# Patient Record
Sex: Female | Born: 1985 | Race: White | Hispanic: No | Marital: Married | Smoking: Former smoker
Health system: Southern US, Community
[De-identification: ages and names within clinical notes are randomized; demographics above are authoritative.]

## PROBLEM LIST (undated history)

## (undated) DIAGNOSIS — E039 Hypothyroidism, unspecified: Secondary | ICD-10-CM

## (undated) DIAGNOSIS — E119 Type 2 diabetes mellitus without complications: Secondary | ICD-10-CM

## (undated) DIAGNOSIS — G43909 Migraine, unspecified, not intractable, without status migrainosus: Secondary | ICD-10-CM

## (undated) DIAGNOSIS — I10 Essential (primary) hypertension: Secondary | ICD-10-CM

## (undated) HISTORY — PX: DILATION AND CURETTAGE OF UTERUS: SHX78

## (undated) SURGERY — Surgical Case
Anesthesia: *Unknown

---
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
## 2005-12-12 ENCOUNTER — Emergency Department (HOSPITAL_COMMUNITY): Admission: EM | Admit: 2005-12-12 | Discharge: 2005-12-12 | Payer: Self-pay | Admitting: Family Medicine

## 2005-12-13 ENCOUNTER — Emergency Department (HOSPITAL_COMMUNITY): Admission: EM | Admit: 2005-12-13 | Discharge: 2005-12-13 | Payer: Self-pay | Admitting: *Deleted

## 2007-10-01 ENCOUNTER — Emergency Department (HOSPITAL_COMMUNITY): Admission: EM | Admit: 2007-10-01 | Discharge: 2007-10-01 | Payer: Self-pay | Admitting: Emergency Medicine

## 2008-08-11 ENCOUNTER — Ambulatory Visit (HOSPITAL_COMMUNITY): Admission: RE | Admit: 2008-08-11 | Discharge: 2008-08-11 | Payer: Self-pay | Admitting: Obstetrics and Gynecology

## 2008-08-28 ENCOUNTER — Emergency Department (HOSPITAL_COMMUNITY): Admission: EM | Admit: 2008-08-28 | Discharge: 2008-08-28 | Payer: Self-pay | Admitting: Family Medicine

## 2008-09-10 ENCOUNTER — Ambulatory Visit (HOSPITAL_COMMUNITY): Admission: RE | Admit: 2008-09-10 | Discharge: 2008-09-10 | Payer: Self-pay | Admitting: Obstetrics and Gynecology

## 2010-10-18 LAB — POCT I-STAT, CHEM 8
BUN: 12 mg/dL (ref 6–23)
Chloride: 106 mEq/L (ref 96–112)
Creatinine, Ser: 0.7 mg/dL (ref 0.4–1.2)
Potassium: 4 mEq/L (ref 3.5–5.1)
Sodium: 141 mEq/L (ref 135–145)

## 2011-07-03 ENCOUNTER — Encounter (HOSPITAL_COMMUNITY): Admission: RE | Payer: Self-pay | Source: Ambulatory Visit

## 2011-07-03 ENCOUNTER — Inpatient Hospital Stay (HOSPITAL_COMMUNITY)
Admission: RE | Admit: 2011-07-03 | Payer: BC Managed Care – PPO | Source: Ambulatory Visit | Admitting: Obstetrics and Gynecology

## 2011-07-03 SURGERY — DILATATION AND CURETTAGE /HYSTEROSCOPY
Anesthesia: Choice

## 2012-09-11 ENCOUNTER — Other Ambulatory Visit (HOSPITAL_COMMUNITY)
Admission: RE | Admit: 2012-09-11 | Discharge: 2012-09-11 | Disposition: A | Discharge status: Home / Self Care | Payer: BC Managed Care – PPO | Source: Ambulatory Visit | Attending: Obstetrics and Gynecology | Admitting: Obstetrics and Gynecology

## 2012-09-11 ENCOUNTER — Other Ambulatory Visit: Payer: Self-pay | Admitting: Obstetrics and Gynecology

## 2012-09-11 DIAGNOSIS — Z1151 Encounter for screening for human papillomavirus (HPV): Secondary | ICD-10-CM | POA: Insufficient documentation

## 2012-09-11 DIAGNOSIS — Z01419 Encounter for gynecological examination (general) (routine) without abnormal findings: Secondary | ICD-10-CM | POA: Insufficient documentation

## 2013-12-17 ENCOUNTER — Other Ambulatory Visit: Payer: Self-pay | Admitting: Obstetrics and Gynecology

## 2014-01-26 ENCOUNTER — Ambulatory Visit: Payer: BC Managed Care – PPO | Attending: Gynecologic Oncology | Admitting: Gynecologic Oncology

## 2015-04-06 ENCOUNTER — Other Ambulatory Visit: Payer: Self-pay | Admitting: Obstetrics and Gynecology

## 2015-04-08 LAB — CYTOLOGY - PAP

## 2016-03-16 ENCOUNTER — Encounter: Payer: Self-pay | Admitting: Medical Oncology

## 2016-03-16 ENCOUNTER — Emergency Department
Admission: EM | Admit: 2016-03-16 | Discharge: 2016-03-16 | Disposition: A | Discharge status: Home / Self Care | Payer: 59 | Attending: Emergency Medicine | Admitting: Emergency Medicine

## 2016-03-16 DIAGNOSIS — Z79899 Other long term (current) drug therapy: Secondary | ICD-10-CM | POA: Diagnosis not present

## 2016-03-16 DIAGNOSIS — R51 Headache: Secondary | ICD-10-CM | POA: Insufficient documentation

## 2016-03-16 DIAGNOSIS — E119 Type 2 diabetes mellitus without complications: Secondary | ICD-10-CM | POA: Insufficient documentation

## 2016-03-16 DIAGNOSIS — R519 Headache, unspecified: Secondary | ICD-10-CM

## 2016-03-16 HISTORY — DX: Migraine, unspecified, not intractable, without status migrainosus: G43.909

## 2016-03-16 HISTORY — DX: Type 2 diabetes mellitus without complications: E11.9

## 2016-03-16 MED ORDER — METOCLOPRAMIDE HCL 5 MG/ML IJ SOLN
10.0000 mg | Freq: Once | INTRAMUSCULAR | Status: AC
  Administered 2016-03-16: 10 mg via INTRAVENOUS
  Filled 2016-03-16: qty 2

## 2016-03-16 MED ORDER — KETOROLAC TROMETHAMINE 30 MG/ML IJ SOLN
30.0000 mg | Freq: Once | INTRAMUSCULAR | Status: AC
  Administered 2016-03-16: 30 mg via INTRAVENOUS
  Filled 2016-03-16: qty 1

## 2016-03-16 MED ORDER — DIPHENHYDRAMINE HCL 50 MG/ML IJ SOLN
25.0000 mg | Freq: Once | INTRAMUSCULAR | Status: AC
  Administered 2016-03-16: 25 mg via INTRAVENOUS
  Filled 2016-03-16: qty 1

## 2016-03-16 NOTE — ED Provider Notes (Signed)
[facility] Emergency Department Provider Note ____________________________________________   I have reviewed the triage vital signs and the triage nursing note.  HISTORY  Chief Complaint Migraine   Historian Patient  HPI Donna BaneJennifer Santiago is a 30 y.o. female with a history of migraines, takes imitrex at home, presents for headache onset last night, gradual and persistent and severe.  Usually starts behind both eyes and now is global. Similar prior migraines, but this seems to be worse in terms of length and severity. She feels like she's had some decreased/blurry vision. She often does have vision changes with her migraines. She tells me that she also had a history of pseudotumor cerebri when she was younger and has had a lumbar puncture in the past, sound like this was many years ago. Currently she does not follow with a neurologist.  Headache is moderate to severe. Nothing makes it worse or better. Nausea without vomiting.    Past Medical History:  Diagnosis Date  . Diabetes mellitus without complication (HCC)   . Migraines   Pseudo-tumor cerebri  There are no active problems to display for this patient.   No past surgical history on file.  Prior to Admission medications   Medication Sig Start Date End Date Taking? Authorizing Provider  glipiZIDE (GLUCOTROL XL) 5 MG 24 hr tablet Take 1 tablet by mouth daily. 02/25/16  Yes Historical Provider, MD  levothyroxine (SYNTHROID, LEVOTHROID) 112 MCG tablet Take 1 tablet by mouth 2 (two) times daily.  01/18/16 01/17/17 Yes Historical Provider, MD  norethindrone-ethinyl estradiol-iron (JUNEL FE 1.5/30) 1.5-30 MG-MCG tablet Take 1 tablet by mouth daily. 09/21/15  Yes Historical Provider, MD  SUMAtriptan (IMITREX) 100 MG tablet Take 1 tablet by mouth daily as needed. 01/28/16  Yes Historical Provider, MD    Allergies  Allergen Reactions  . Penicillins   . Prednisone     No family history on file.  Social  History Social History  Substance Use Topics  . Smoking status: Not on file  . Smokeless tobacco: Not on file  . Alcohol use Not on file    Review of Systems  Constitutional: Negative for fever. Eyes: Blurry/dim vision. ENT: Negative for sore throat. Cardiovascular: Negative for chest pain. Respiratory: Negative for shortness of breath. Gastrointestinal: Negative for abdominal pain, vomiting and diarrhea. Genitourinary: Negative for dysuria. Musculoskeletal: Negative for back pain. Skin: Negative for rash. Neurological: Positive for headache. 10 point Review of Systems otherwise negative ____________________________________________   PHYSICAL EXAM:  VITAL SIGNS: ED Triage Vitals  Enc Vitals Group     BP 03/16/16 0922 (!) 173/96     Pulse Rate 03/16/16 0922 73     Resp 03/16/16 0922 18     Temp 03/16/16 0922 98 F (36.7 C)     Temp Source 03/16/16 0922 Oral     SpO2 03/16/16 0922 99 %     Weight 03/16/16 0923 (!) 318 lb (144.2 kg)     Height 03/16/16 0923 5\' 4"  (1.626 m)     Head Circumference --      Peak Flow --      Pain Score 03/16/16 0923 10     Pain Loc --      Pain Edu? --      Excl. in GC? --      Constitutional: Alert and oriented. Well appearing and in no distress. HEENT   Head: Normocephalic and atraumatic.      Eyes: Conjunctivae are normal. PERRL. Normal extraocular movements.  No papilledema  Ears:         Nose: No congestion/rhinnorhea.   Mouth/Throat: Mucous membranes are moist.   Neck: No stridor. Cardiovascular/Chest: Normal rate, regular rhythm.  No murmurs, rubs, or gallops. Respiratory: Normal respiratory effort without tachypnea nor retractions. Breath sounds are clear and equal bilaterally. No wheezes/rales/rhonchi. Gastrointestinal: Soft. No distention, no guarding, no rebound. Nontender.  Morbidly obese.  Genitourinary/rectal:Deferred Musculoskeletal: Nontender with normal range of motion in all extremities. No joint  effusions.  No lower extremity tenderness.  No edema. Neurologic:  Normal speech and language. No gross or focal neurologic deficits are appreciated. Skin:  Skin is warm, dry and intact. No rash noted. Psychiatric: Mood and affect are normal. Speech and behavior are normal. Patient exhibits appropriate insight and judgment.   ____________________________________________  LABS (pertinent positives/negatives)  Labs Reviewed - No data to display  ____________________________________________    EKG I, Governor Rooks, MD, the attending physician have personally viewed and interpreted all ECGs.  None ____________________________________________  RADIOLOGY All Xrays were viewed by me. Imaging interpreted by Radiologist.  None __________________________________________  PROCEDURES  Procedure(s) performed: None  Critical Care performed: None  ____________________________________________   ED COURSE / ASSESSMENT AND PLAN  Pertinent labs & imaging results that were available during my care of the patient were reviewed by me and considered in my medical decision making (see chart for details).   Ms. Geddis presented today planning of a migraine headache. After further and full history, it turns out she also carried a diagnosis of pseudotumor cerebri in the past. She is complaining of some vision changes, sound like more blurriness rather than loss of vision. No papilledema on my exam. I am going to treat her for migraine type headache with Toradol, Benadryl, and Reglan.  She states that she had head imaging years ago, I don't see a focal neurologic deficit to make me more concerned for an intracranial emergency for imaging.  At 1:30 PM, I rechecked on her and she feels like her headache is gone and no vision changes. I'm going to go ahead and discharge her home, and I recommended follow-up with her primary care physician, neurology, and ophthalmology with her history of increased  frequency of headaches and history of pseudotumor cerebri in the past.    CONSULTATIONS:  None    Patient / Family / Caregiver informed of clinical course, medical decision-making process, and agree with plan.   I discussed return precautions, follow-up instructions, and discharge instructions with patient and/or family.   ___________________________________________   FINAL CLINICAL IMPRESSION(S) / ED DIAGNOSES   Final diagnoses:  Headache, unspecified headache type              Note: This dictation was prepared with Dragon dictation. Any transcriptional errors that result from this process are unintentional    Governor Rooks, MD 03/16/16 1347

## 2016-03-16 NOTE — Discharge Instructions (Signed)
You were evaluated for headache similar to prior migraines. He was given treatment for this in the emergency department, and with her headache gone, we discussed discharge from the emergency department now.  Return to the emergency department for any worsening headache, any loss of vision, vision changes, fever, vomiting, weakness, numbness, passing out, or any other symptoms concerning to you.  I recommend he follow up with primary care physician within one week. I am recommending that because of your history of pseudotumor cerebri, I would like you to follow-up with an ophthalmologist for a dilated eye exam. Because of frequency and severity of headaches and your history of migraines and pseudotumor cerebri, I would like you to follow-up with a neurologist, and Dr. Margaretmary EddyShah's office number is provided.

## 2016-03-16 NOTE — ED Triage Notes (Signed)
Pt reports she has a hx of migraines, began yesterday with migraine and this am with vomiting. Home meds have not helped.

## 2016-04-17 ENCOUNTER — Other Ambulatory Visit: Payer: Self-pay | Admitting: Neurology

## 2016-04-17 DIAGNOSIS — G43909 Migraine, unspecified, not intractable, without status migrainosus: Secondary | ICD-10-CM

## 2016-04-17 DIAGNOSIS — G932 Benign intracranial hypertension: Secondary | ICD-10-CM

## 2016-05-09 ENCOUNTER — Ambulatory Visit: Payer: 59

## 2016-05-12 ENCOUNTER — Ambulatory Visit
Admission: RE | Admit: 2016-05-12 | Discharge: 2016-05-12 | Disposition: A | Discharge status: Home / Self Care | Payer: 59 | Source: Ambulatory Visit | Attending: Neurology | Admitting: Neurology

## 2016-05-12 DIAGNOSIS — J351 Hypertrophy of tonsils: Secondary | ICD-10-CM | POA: Diagnosis not present

## 2016-05-12 DIAGNOSIS — G43909 Migraine, unspecified, not intractable, without status migrainosus: Secondary | ICD-10-CM | POA: Insufficient documentation

## 2016-05-12 DIAGNOSIS — J329 Chronic sinusitis, unspecified: Secondary | ICD-10-CM | POA: Insufficient documentation

## 2016-05-12 DIAGNOSIS — G932 Benign intracranial hypertension: Secondary | ICD-10-CM

## 2017-06-21 ENCOUNTER — Other Ambulatory Visit: Payer: Self-pay

## 2017-06-21 ENCOUNTER — Emergency Department
Admission: EM | Admit: 2017-06-21 | Discharge: 2017-06-21 | Disposition: A | Discharge status: Home / Self Care | Payer: BLUE CROSS/BLUE SHIELD | Attending: Emergency Medicine | Admitting: Emergency Medicine

## 2017-06-21 ENCOUNTER — Encounter: Payer: Self-pay | Admitting: Emergency Medicine

## 2017-06-21 DIAGNOSIS — E119 Type 2 diabetes mellitus without complications: Secondary | ICD-10-CM | POA: Diagnosis not present

## 2017-06-21 DIAGNOSIS — G43809 Other migraine, not intractable, without status migrainosus: Secondary | ICD-10-CM | POA: Diagnosis not present

## 2017-06-21 DIAGNOSIS — Z79899 Other long term (current) drug therapy: Secondary | ICD-10-CM | POA: Diagnosis not present

## 2017-06-21 DIAGNOSIS — Z87891 Personal history of nicotine dependence: Secondary | ICD-10-CM | POA: Diagnosis not present

## 2017-06-21 DIAGNOSIS — G43801 Other migraine, not intractable, with status migrainosus: Secondary | ICD-10-CM

## 2017-06-21 DIAGNOSIS — Z7984 Long term (current) use of oral hypoglycemic drugs: Secondary | ICD-10-CM | POA: Diagnosis not present

## 2017-06-21 DIAGNOSIS — R1110 Vomiting, unspecified: Secondary | ICD-10-CM | POA: Diagnosis present

## 2017-06-21 LAB — CBC WITH DIFFERENTIAL/PLATELET
BASOS ABS: 0 10*3/uL (ref 0–0.1)
BASOS PCT: 1 %
Eosinophils Absolute: 0.2 10*3/uL (ref 0–0.7)
Eosinophils Relative: 2 %
HEMATOCRIT: 43.6 % (ref 35.0–47.0)
HEMOGLOBIN: 15 g/dL (ref 12.0–16.0)
Lymphocytes Relative: 20 %
Lymphs Abs: 1.8 10*3/uL (ref 1.0–3.6)
MCH: 31.5 pg (ref 26.0–34.0)
MCHC: 34.5 g/dL (ref 32.0–36.0)
MCV: 91.3 fL (ref 80.0–100.0)
MONOS PCT: 7 %
Monocytes Absolute: 0.6 10*3/uL (ref 0.2–0.9)
NEUTROS ABS: 6.3 10*3/uL (ref 1.4–6.5)
NEUTROS PCT: 70 %
Platelets: 258 10*3/uL (ref 150–440)
RBC: 4.78 MIL/uL (ref 3.80–5.20)
RDW: 12.5 % (ref 11.5–14.5)
WBC: 9 10*3/uL (ref 3.6–11.0)

## 2017-06-21 LAB — COMPREHENSIVE METABOLIC PANEL
ALBUMIN: 3.8 g/dL (ref 3.5–5.0)
ALK PHOS: 75 U/L (ref 38–126)
ALT: 44 U/L (ref 14–54)
AST: 43 U/L — AB (ref 15–41)
Anion gap: 9 (ref 5–15)
BILIRUBIN TOTAL: 0.7 mg/dL (ref 0.3–1.2)
BUN: 8 mg/dL (ref 6–20)
CALCIUM: 8.9 mg/dL (ref 8.9–10.3)
CO2: 24 mmol/L (ref 22–32)
Chloride: 101 mmol/L (ref 101–111)
Creatinine, Ser: 0.65 mg/dL (ref 0.44–1.00)
GFR calc Af Amer: >60 mL/min (ref >60)
GFR calc non Af Amer: >60 mL/min (ref >60)
GLUCOSE: 278 mg/dL — AB (ref 65–99)
Potassium: 4.1 mmol/L (ref 3.5–5.1)
Sodium: 134 mmol/L — ABNORMAL LOW (ref 135–145)
TOTAL PROTEIN: 7.6 g/dL (ref 6.5–8.1)

## 2017-06-21 MED ORDER — DIPHENHYDRAMINE HCL 25 MG PO CAPS
50.0000 mg | ORAL_CAPSULE | Freq: Once | ORAL | Status: AC
  Administered 2017-06-21: 50 mg via ORAL
  Filled 2017-06-21: qty 2

## 2017-06-21 MED ORDER — ONDANSETRON 4 MG PO TBDP
8.0000 mg | ORAL_TABLET | Freq: Once | ORAL | Status: AC
  Administered 2017-06-21: 8 mg via ORAL
  Filled 2017-06-21: qty 2

## 2017-06-21 MED ORDER — DIPHENHYDRAMINE HCL 25 MG PO CAPS: 50.0000 mg | ORAL_CAPSULE | Freq: Four times a day (QID) | ORAL | 0 refills | Status: AC | PRN

## 2017-06-21 MED ORDER — KETOROLAC TROMETHAMINE 60 MG/2ML IM SOLN
15.0000 mg | Freq: Once | INTRAMUSCULAR | Status: AC
  Administered 2017-06-21: 15 mg via INTRAMUSCULAR
  Filled 2017-06-21: qty 2

## 2017-06-21 MED ORDER — METOCLOPRAMIDE HCL 10 MG PO TABS: 10.0000 mg | ORAL_TABLET | Freq: Four times a day (QID) | ORAL | 0 refills | Status: AC | PRN

## 2017-06-21 MED ORDER — GLIPIZIDE ER 5 MG PO TB24: 5.0000 mg | ORAL_TABLET | Freq: Every day | ORAL | 0 refills | Status: AC

## 2017-06-21 MED ORDER — METOCLOPRAMIDE HCL 5 MG/ML IJ SOLN
10.0000 mg | Freq: Once | INTRAMUSCULAR | Status: AC
  Administered 2017-06-21: 10 mg via INTRAMUSCULAR
  Filled 2017-06-21: qty 2

## 2017-06-21 MED ORDER — METOCLOPRAMIDE HCL 5 MG/ML IJ SOLN: 10.0000 mg | Freq: Once | INTRAMUSCULAR | Status: DC

## 2017-06-21 MED ORDER — SUMATRIPTAN SUCCINATE 100 MG PO TABS: 100.0000 mg | ORAL_TABLET | Freq: Every day | ORAL | 0 refills | Status: AC | PRN

## 2017-06-21 MED ORDER — NAPROXEN 500 MG PO TABS: 500.0000 mg | ORAL_TABLET | Freq: Two times a day (BID) | ORAL | 0 refills | Status: AC

## 2017-06-21 MED ORDER — ONDANSETRON 4 MG PO TBDP
4.0000 mg | ORAL_TABLET | Freq: Once | ORAL | Status: AC
  Administered 2017-06-21: 4 mg via ORAL
  Filled 2017-06-21: qty 1

## 2017-06-21 NOTE — ED Provider Notes (Signed)
[facility] Emergency Department Provider Note  ____________________________________________  Time seen: Approximately 8:19 PM  I have reviewed the triage vital signs and the nursing notes.   HISTORY  Chief Complaint Emesis and Migraine    HPI Rio Taber is a 31 y.o. female with a history of migraine headaches and diabetes who complains of a migraine headache that started about 3 AM this morning while she was having some difficulty sleeping. She is out of her sumatriptan and and glipizide and so her blood sugars been running a little higher recently and she is unable to use the Imitrex to abort the migraine which normally works for her. Symptoms of been constant all day, bilateral retro-orbital headache with no vision changes. Feels just like her usual migraines. This associated vomiting as is typical for her. Said a total of 3 episodes of vomiting, which were also blood streaked. She denies any black or bloody or runny stool recently. No history of bleeding ulcers. Not taking a lot of NSAIDs at home for steroids. Denies abdominal pain.     Past Medical History:  Diagnosis Date  . Diabetes mellitus without complication (HCC)   . Migraines      There are no active problems to display for this patient.    History reviewed. No pertinent surgical history.   Prior to Admission medications   Medication Sig Start Date End Date Taking? Authorizing Provider  diphenhydrAMINE (BENADRYL) 25 mg capsule Take 2 capsules (50 mg total) by mouth every 6 (six) hours as needed. 06/21/17   Sharman Cheek, MD  glipiZIDE (GLUCOTROL XL) 5 MG 24 hr tablet Take 1 tablet (5 mg total) by mouth daily. 06/21/17   Sharman Cheek, MD  levothyroxine (SYNTHROID, LEVOTHROID) 112 MCG tablet Take 1 tablet by mouth 2 (two) times daily.  01/18/16 01/17/17  [provider]  metoCLOPramide (REGLAN) 10 MG tablet Take 1 tablet (10 mg total) by mouth every 6 (six) hours as  needed. 06/21/17   Sharman Cheek, MD  naproxen (NAPROSYN) 500 MG tablet Take 1 tablet (500 mg total) by mouth 2 (two) times daily with a meal. 06/21/17   Sharman Cheek, MD  norethindrone-ethinyl estradiol-iron (JUNEL FE 1.5/30) 1.5-30 MG-MCG tablet Take 1 tablet by mouth daily. 09/21/15   [provider]  SUMAtriptan (IMITREX) 100 MG tablet Take 1 tablet (100 mg total) by mouth daily as needed. 06/21/17   Sharman Cheek, MD     Allergies Penicillins and Prednisone   History reviewed. No pertinent family history.  Social History Social History   Tobacco Use  . Smoking status: Former Games developer  . Smokeless tobacco: Never Used  Substance Use Topics  . Alcohol use: No    Frequency: Never  . Drug use: No    Review of Systems  Constitutional:   No fever or chills.  ENT:   No sore throat. No rhinorrhea. Cardiovascular:   No chest pain or syncope. Respiratory:   No dyspnea or cough. Gastrointestinal:   Negative for abdominal pain, positive vomiting as above. No diarrhea or constipation or melena..  Musculoskeletal:   Negative for focal pain or swelling All other systems reviewed and are negative except as documented above in ROS and HPI.  ____________________________________________   PHYSICAL EXAM:  VITAL SIGNS: ED Triage Vitals  Enc Vitals Group     BP 06/21/17 1555 (!) 154/100     Pulse Rate 06/21/17 1553 87     Resp 06/21/17 1553 18     Temp 06/21/17 1553 98.2  F (36.8 C)     Temp Source 06/21/17 1553 Oral     SpO2 06/21/17 1553 96 %     Weight 06/21/17 1554 (!) 320 lb (145.2 kg)     Height 06/21/17 1554 5\' 5"  (1.651 m)     Head Circumference --      Peak Flow --      Pain Score 06/21/17 1552 10     Pain Loc --      Pain Edu? --      Excl. in GC? --     Vital signs reviewed, nursing assessments reviewed.   Constitutional:   Alert and oriented. Well appearing and in no distress. Morbidly obese Eyes:   No scleral icterus.  EOMI. No nystagmus.  No conjunctival pallor. PERRL. ENT   Head:   Normocephalic and atraumatic.   Nose:   No congestion/rhinnorhea. No epistaxis   Mouth/Throat:   MMM, no pharyngeal erythema. No peritonsillar mass. Oropharynx clear   Neck:   No meningismus. Full ROM. Hematological/Lymphatic/Immunilogical:   No cervical lymphadenopathy. Cardiovascular:   RRR. Symmetric bilateral radial and DP pulses.  No murmurs.  Respiratory:   Normal respiratory effort without tachypnea/retractions. Breath sounds are clear and equal bilaterally. No wheezes/rales/rhonchi. Gastrointestinal:   Soft and nontender. Non distended. There is no CVA tenderness.  No rebound, rigidity, or guarding. Genitourinary:   deferred Musculoskeletal:   Normal range of motion in all extremities. No joint effusions.  No lower extremity tenderness.  No edema. Neurologic:   Normal speech and language.  Motor grossly intact. No gross focal neurologic deficits are appreciated.  Skin:    Skin is warm, dry and intact. No rash noted.  No petechiae, purpura, or bullae.  ____________________________________________    LABS (pertinent positives/negatives) (all labs ordered are listed, but only abnormal results are displayed) Labs Reviewed  COMPREHENSIVE METABOLIC PANEL - Abnormal; Notable for the following components:      Result Value   Sodium 134 (*)    Glucose, Bld 278 (*)    AST 43 (*)    All other components within normal limits  CBC WITH DIFFERENTIAL/PLATELET   ____________________________________________   EKG    ____________________________________________    RADIOLOGY  No results found.  ____________________________________________   PROCEDURES Procedures  ____________________________________________     CLINICAL IMPRESSION / ASSESSMENT AND PLAN / ED COURSE  Pertinent labs & imaging results that were available during my care of the patient were reviewed by me and considered in my medical decision making  (see chart for details).   Patient presents with headache which is typical for her established migraine pattern. Worse than usual symptoms due to not having her Imitrex. Given migraine cocktail in the ED, feeling better on reassessment. Her logically intact without any worrisome exam findings or new symptoms. Imaging not warranted at this time. I'll discharge her home with a refill for her glipizide and sumatriptan as well as medications to manage her migraine episode with Reglan and Benadryl and naproxen. Follow up with her primary care doctor. Return precautions. I think the hematemesis is self-limited, related to her vomiting but not indicative of underlying bleeding ulcer or portal hypertension. Vital signs are unremarkable and was slightly hypertensive, and labs suggests hemoconcentration.      ____________________________________________   FINAL CLINICAL IMPRESSION(S) / ED DIAGNOSES    Final diagnoses:  Other migraine with status migrainosus, not intractable      This SmartLink is deprecated. Use AVSMEDLIST instead to display the medication list for a patient.  Portions of this note were generated with dragon dictation software. Dictation errors may occur despite best attempts at proofreading.    Sharman CheekStafford, Tyauna Lacaze, MD 06/21/17 2023

## 2017-06-21 NOTE — ED Triage Notes (Signed)
Pt c/o migraine starting this morning. Out of her somatotropin. Vomit X 2 and saw some streaks of blood in it. Photophobia.  Feels like normal migraine but when vomits normally doesn't see the blood.  VSS. NAD. Ambulatory without difficulty.

## 2019-06-23 ENCOUNTER — Ambulatory Visit: Payer: Self-pay | Admitting: Obstetrics and Gynecology

## 2019-07-21 ENCOUNTER — Ambulatory Visit: Payer: Self-pay | Admitting: Obstetrics and Gynecology

## 2019-07-23 ENCOUNTER — Telehealth: Payer: Self-pay | Admitting: Obstetrics and Gynecology

## 2019-07-23 NOTE — Telephone Encounter (Signed)
Patient was schedule for New patient Annual 07/21/19. Patient no showed appointment schedule. Contacted patient to reschedule appointment and was unable to reschedule due to patient being at work. Patient requested to call back. Holding records for 30 days

## 2020-06-10 ENCOUNTER — Emergency Department: Payer: 59

## 2020-06-10 ENCOUNTER — Emergency Department
Admission: EM | Admit: 2020-06-10 | Discharge: 2020-06-10 | Disposition: A | Discharge status: Home / Self Care | Payer: 59 | Attending: Emergency Medicine | Admitting: Emergency Medicine

## 2020-06-10 ENCOUNTER — Encounter: Payer: Self-pay | Admitting: Emergency Medicine

## 2020-06-10 ENCOUNTER — Other Ambulatory Visit: Payer: Self-pay

## 2020-06-10 DIAGNOSIS — R112 Nausea with vomiting, unspecified: Secondary | ICD-10-CM | POA: Diagnosis not present

## 2020-06-10 DIAGNOSIS — Z7984 Long term (current) use of oral hypoglycemic drugs: Secondary | ICD-10-CM | POA: Diagnosis not present

## 2020-06-10 DIAGNOSIS — E119 Type 2 diabetes mellitus without complications: Secondary | ICD-10-CM | POA: Diagnosis not present

## 2020-06-10 DIAGNOSIS — R1032 Left lower quadrant pain: Secondary | ICD-10-CM | POA: Diagnosis not present

## 2020-06-10 DIAGNOSIS — Z87891 Personal history of nicotine dependence: Secondary | ICD-10-CM | POA: Diagnosis not present

## 2020-06-10 DIAGNOSIS — R197 Diarrhea, unspecified: Secondary | ICD-10-CM | POA: Insufficient documentation

## 2020-06-10 LAB — CBC
HCT: 45.2 % (ref 36.0–46.0)
Hemoglobin: 15.2 g/dL — ABNORMAL HIGH (ref 12.0–15.0)
MCH: 31.2 pg (ref 26.0–34.0)
MCHC: 33.6 g/dL (ref 30.0–36.0)
MCV: 92.8 fL (ref 80.0–100.0)
Platelets: 300 10*3/uL (ref 150–400)
RBC: 4.87 MIL/uL (ref 3.87–5.11)
RDW: 12.2 % (ref 11.5–15.5)
WBC: 8.8 10*3/uL (ref 4.0–10.5)
nRBC: 0 % (ref 0.0–0.2)

## 2020-06-10 LAB — COMPREHENSIVE METABOLIC PANEL
ALT: 38 U/L (ref 0–44)
AST: 26 U/L (ref 15–41)
Albumin: 3.6 g/dL (ref 3.5–5.0)
Alkaline Phosphatase: 62 U/L (ref 38–126)
Anion gap: 9 (ref 5–15)
BUN: 10 mg/dL (ref 6–20)
CO2: 24 mmol/L (ref 22–32)
Calcium: 8.8 mg/dL — ABNORMAL LOW (ref 8.9–10.3)
Chloride: 104 mmol/L (ref 98–111)
Creatinine, Ser: 0.72 mg/dL (ref 0.44–1.00)
GFR, Estimated: >60 mL/min (ref >60)
Glucose, Bld: 198 mg/dL — ABNORMAL HIGH (ref 70–99)
Potassium: 3.9 mmol/L (ref 3.5–5.1)
Sodium: 137 mmol/L (ref 135–145)
Total Bilirubin: 1 mg/dL (ref 0.3–1.2)
Total Protein: 7.5 g/dL (ref 6.5–8.1)

## 2020-06-10 LAB — URINALYSIS, COMPLETE (UACMP) WITH MICROSCOPIC
Bilirubin Urine: NEGATIVE
Glucose, UA: 50 mg/dL — AB
Ketones, ur: NEGATIVE mg/dL
Leukocytes,Ua: NEGATIVE
Nitrite: NEGATIVE
Protein, ur: 100 mg/dL — AB
RBC / HPF: >50 RBC/hpf — ABNORMAL HIGH (ref 0–5)
Specific Gravity, Urine: 1.028 (ref 1.005–1.030)
pH: 5 (ref 5.0–8.0)

## 2020-06-10 LAB — PREGNANCY, URINE: Preg Test, Ur: NEGATIVE

## 2020-06-10 LAB — LIPASE, BLOOD: Lipase: 26 U/L (ref 11–51)

## 2020-06-10 MED ORDER — KETOROLAC TROMETHAMINE 30 MG/ML IJ SOLN
15.0000 mg | Freq: Once | INTRAMUSCULAR | Status: AC
  Administered 2020-06-10: 15 mg via INTRAVENOUS
  Filled 2020-06-10: qty 1

## 2020-06-10 MED ORDER — IBUPROFEN 600 MG PO TABS: 600.0000 mg | ORAL_TABLET | Freq: Three times a day (TID) | ORAL | 0 refills | Status: AC | PRN

## 2020-06-10 MED ORDER — ONDANSETRON 4 MG PO TBDP: 4.0000 mg | ORAL_TABLET | Freq: Once | ORAL | Status: DC | PRN

## 2020-06-10 MED ORDER — ONDANSETRON HCL 4 MG/2ML IJ SOLN
4.0000 mg | Freq: Once | INTRAMUSCULAR | Status: AC
  Administered 2020-06-10: 4 mg via INTRAVENOUS
  Filled 2020-06-10: qty 2

## 2020-06-10 MED ORDER — DICYCLOMINE HCL 10 MG PO CAPS: 10.0000 mg | ORAL_CAPSULE | Freq: Three times a day (TID) | ORAL | 0 refills | Status: DC

## 2020-06-10 MED ORDER — PROCHLORPERAZINE EDISYLATE 10 MG/2ML IJ SOLN
10.0000 mg | Freq: Once | INTRAMUSCULAR | Status: AC
  Administered 2020-06-10: 10 mg via INTRAVENOUS
  Filled 2020-06-10: qty 2

## 2020-06-10 MED ORDER — PROMETHAZINE HCL 25 MG PO TABS: 25.0000 mg | ORAL_TABLET | Freq: Three times a day (TID) | ORAL | 0 refills | Status: DC | PRN

## 2020-06-10 MED ORDER — SODIUM CHLORIDE 0.9 % IV BOLUS
1000.0000 mL | Freq: Once | INTRAVENOUS | Status: AC
  Administered 2020-06-10: 1000 mL via INTRAVENOUS

## 2020-06-10 MED ORDER — DICYCLOMINE HCL 10 MG PO CAPS: 10.0000 mg | ORAL_CAPSULE | Freq: Three times a day (TID) | ORAL | 0 refills | Status: AC

## 2020-06-10 MED ORDER — PROMETHAZINE HCL 25 MG PO TABS: 25.0000 mg | ORAL_TABLET | Freq: Three times a day (TID) | ORAL | 0 refills | Status: AC | PRN

## 2020-06-10 MED ORDER — DIPHENHYDRAMINE HCL 50 MG/ML IJ SOLN
25.0000 mg | Freq: Once | INTRAMUSCULAR | Status: AC
  Administered 2020-06-10: 25 mg via INTRAVENOUS
  Filled 2020-06-10: qty 1

## 2020-06-10 MED ORDER — IBUPROFEN 600 MG PO TABS: 600.0000 mg | ORAL_TABLET | Freq: Three times a day (TID) | ORAL | 0 refills | Status: DC | PRN

## 2020-06-10 MED ORDER — MORPHINE SULFATE (PF) 4 MG/ML IV SOLN
6.0000 mg | Freq: Once | INTRAVENOUS | Status: AC
  Administered 2020-06-10: 6 mg via INTRAVENOUS
  Filled 2020-06-10: qty 2

## 2020-06-10 NOTE — ED Triage Notes (Signed)
Pt via pov from home with abdominal and epigastric pain that is intermittent. She also reports diarrhea since yesterday. She saw her PCP yesterday, who did labs and urinalysis. She was told she may have kidney or gallstones. Pt states "it hurts to eat or drink." She also reports nausea with one episode of emesis this morning. Pt alert & oriented, nad noted.

## 2020-06-10 NOTE — ED Notes (Signed)
VS obtained by this RN. Pt asking about wait times at this time. This RN apologized and explained delay.

## 2020-06-10 NOTE — ED Notes (Signed)
Pt to CT at this time.

## 2020-06-10 NOTE — ED Notes (Signed)
Pt verbalized understanding of d/c instructions at this time. Pt denies further questions. Pt ambulated to lobby at this time, tolerated well. Steady gate and NAD noted.

## 2020-06-10 NOTE — ED Provider Notes (Addendum)
[facility] Emergency Department Provider Note  ____________________________________________   Event Date/Time   First MD Initiated Contact with Patient 06/10/20 1235     (approximate)  I have reviewed the triage vital signs and the nursing notes.   HISTORY  Chief Complaint Abdominal Pain    HPI Donna BaneJennifer Santiago is a 34 y.o. female with history of diabetes, migraines, here with abdominal pain.  The patient states that over the last several days, she has had increasingly severe, sharp, stabbing, left lower quadrant abdominal pain.  She said associated nausea, vomiting, and diarrhea.  Pain is fairly constant, but worse with movement and palpation.  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  Denies any cough.  Denies any vaginal bleeding or discharge.  No other acute complaints.        Past Medical History:  Diagnosis Date  . Diabetes mellitus without complication (HCC)   . Migraines     There are no problems to display for this patient.   History reviewed. No pertinent surgical history.  Prior to Admission medications   Medication Sig Start Date End Date Taking? Authorizing Provider  dicyclomine (BENTYL) 10 MG capsule Take 1 capsule (10 mg total) by mouth 4 (four) times daily -  before meals and at bedtime for 5 days. 06/10/20 06/15/20  Shaune PollackIsaacs, Jacobs Golab, MD  diphenhydrAMINE (BENADRYL) 25 mg capsule Take 2 capsules (50 mg total) by mouth every 6 (six) hours as needed. 06/21/17   Sharman CheekStafford, Phillip, MD  glipiZIDE (GLUCOTROL XL) 5 MG 24 hr tablet Take 1 tablet (5 mg total) by mouth daily. 06/21/17   Sharman CheekStafford, Phillip, MD  ibuprofen (ADVIL) 600 MG tablet Take 1 tablet (600 mg total) by mouth every 8 (eight) hours as needed for moderate pain. 06/10/20   Shaune PollackIsaacs, Dagan Heinz, MD  levothyroxine (SYNTHROID, LEVOTHROID) 112 MCG tablet Take 1 tablet by mouth 2 (two) times daily.  01/18/16 01/17/17  [provider]  metoCLOPramide (REGLAN) 10 MG tablet Take 1 tablet (10 mg total) by mouth every 6 (six) hours as needed. 06/21/17   Sharman CheekStafford, Phillip, MD  naproxen (NAPROSYN) 500 MG tablet Take 1 tablet (500 mg total) by mouth 2 (two) times daily with a meal. 06/21/17   Sharman CheekStafford, Phillip, MD  norethindrone-ethinyl estradiol-iron (JUNEL FE 1.5/30) 1.5-30 MG-MCG tablet Take 1 tablet by mouth daily. 09/21/15   [provider]  promethazine (PHENERGAN) 25 MG tablet Take 1 tablet (25 mg total) by mouth every 8 (eight) hours as needed for nausea or vomiting. 06/10/20   Shaune PollackIsaacs, Autumn Pruitt, MD  SUMAtriptan (IMITREX) 100 MG tablet Take 1 tablet (100 mg total) by mouth daily as needed. 06/21/17   Sharman CheekStafford, Phillip, MD    Allergies Penicillins and Prednisone  History reviewed. No pertinent family history.  Social History Social History   Tobacco Use  . Smoking status: Former Games developermoker  . Smokeless tobacco: Never Used  Substance Use Topics  . Alcohol use: No  . Drug use: No    Review of Systems  Review of Systems  Constitutional: Negative for fatigue and fever.  HENT: Negative for congestion and sore throat.   Eyes: Negative for visual disturbance.  Respiratory: Negative for cough and shortness of breath.   Cardiovascular: Negative for chest pain.  Gastrointestinal: Positive for abdominal pain, diarrhea, nausea and vomiting.  Genitourinary: Positive for flank pain.  Musculoskeletal: Negative for back pain and neck pain.  Skin: Negative for rash and wound.  Neurological: Negative for weakness.     ____________________________________________  PHYSICAL EXAM:      VITAL SIGNS: ED Triage Vitals  Enc Vitals Group     BP 06/10/20 0740 134/88     Pulse Rate 06/10/20 0740 76     Resp 06/10/20 0740 18     Temp 06/10/20 0740 97.8 F (36.6 C)     Temp src --      SpO2 06/10/20 0740 98 %     Weight 06/10/20 0740 295 lb (133.8 kg)     Height 06/10/20 0740 5\' 5"  (1.651 m)     Head Circumference --       Peak Flow --      Pain Score 06/10/20 0757 10     Pain Loc --      Pain Edu? --      Excl. in GC? --      Physical Exam Vitals and nursing note reviewed.  Constitutional:      General: She is not in acute distress.    Appearance: She is well-developed and well-nourished.  HENT:     Head: Normocephalic and atraumatic.  Eyes:     Conjunctiva/sclera: Conjunctivae normal.  Cardiovascular:     Rate and Rhythm: Normal rate and regular rhythm.     Heart sounds: Normal heart sounds.  Pulmonary:     Effort: Pulmonary effort is normal. No respiratory distress.     Breath sounds: No wheezing.  Abdominal:     General: There is no distension.     Tenderness: There is abdominal tenderness in the periumbilical area, left upper quadrant and left lower quadrant. There is no guarding or rebound.  Musculoskeletal:        General: No edema.     Cervical back: Neck supple.  Skin:    General: Skin is warm.     Capillary Refill: Capillary refill takes less than 2 seconds.     Findings: No rash.  Neurological:     Mental Status: She is alert and oriented to person, place, and time.     Motor: No abnormal muscle tone.       ____________________________________________   LABS (all labs ordered are listed, but only abnormal results are displayed)  Labs Reviewed  COMPREHENSIVE METABOLIC PANEL - Abnormal; Notable for the following components:      Result Value   Glucose, Bld 198 (*)    Calcium 8.8 (*)    All other components within normal limits  CBC - Abnormal; Notable for the following components:   Hemoglobin 15.2 (*)    All other components within normal limits  URINALYSIS, COMPLETE (UACMP) WITH MICROSCOPIC - Abnormal; Notable for the following components:   Color, Urine AMBER (*)    APPearance CLOUDY (*)    Glucose, UA 50 (*)    Hgb urine dipstick LARGE (*)    Protein, ur 100 (*)    RBC / HPF >50 (*)    Bacteria, UA RARE (*)    All other components within normal limits   LIPASE, BLOOD  PREGNANCY, URINE    ____________________________________________  EKG: Normal sinus rhythm, ventricular rate 77.  PR 134, QRS 84, QTc 468.  No acute ST elevations or depressions. ________________________________________  RADIOLOGY All imaging, including plain films, CT scans, and ultrasounds, independently reviewed by me, and interpretations confirmed via formal radiology reads.  ED MD interpretation:   CT abdomen/pelvis: No acute abnormality, probable left renal cyst  Official radiology report(s): CT Renal Stone Study  Result Date: 06/10/2020 CLINICAL DATA:  Abdominal and epigastric pain intermittently, diarrhea yesterday, flank pain, question renal calculus EXAM: CT ABDOMEN AND PELVIS WITHOUT CONTRAST TECHNIQUE: Multidetector CT imaging of the abdomen and pelvis was performed following the standard protocol without IV contrast. Sagittal and coronal MPR images reconstructed from axial data set. Oral contrast not administered for this indication. COMPARISON:  None FINDINGS: Lower chest: Lung bases clear Hepatobiliary: Gallbladder and liver normal appearance Pancreas: Normal appearance Spleen: Normal appearance Adrenals/Urinary Tract: Adrenal glands and RIGHT kidney normal appearance. Low-attenuation region at mid LEFT kidney laterally, 1.5 cm diameter, likely small cyst. Kidneys, ureters and bladder otherwise normal appearance. No urinary tract calcifications or dilatation. Stomach/Bowel: Stomach and bowel loops normal appearance. Normal appendix. Vascular/Lymphatic: Aorta normal caliber.  No adenopathy. Reproductive: Unremarkable uterus and adnexa Other: No free air or free fluid. No hernia or inflammatory process. Musculoskeletal: Unremarkable IMPRESSION: Probable 1.5 cm LEFT renal cyst. Otherwise negative exam. Electronically Signed   By: Ulyses Southward M.D.   On: 06/10/2020 13:58    ____________________________________________  PROCEDURES   Procedure(s) performed  (including Critical Care):  Procedures  ____________________________________________  INITIAL IMPRESSION / MDM / ASSESSMENT AND PLAN / ED COURSE  As part of my medical decision making, I reviewed the following data within the electronic MEDICAL RECORD NUMBER Nursing notes reviewed and incorporated, Old chart reviewed, Notes from prior ED visits, and [place] Controlled Substance Database       *Donna Santiago was evaluated in Emergency Department on 06/10/2020 for the symptoms described in the history of present illness. She was evaluated in the context of the global COVID-19 pandemic, which necessitated consideration that the patient might be at risk for infection with the SARS-CoV-2 virus that causes COVID-19. Institutional protocols and algorithms that pertain to the evaluation of patients at risk for COVID-19 are in a state of rapid change based on information released by regulatory bodies including the CDC and federal and state organizations. These policies and algorithms were followed during the patient's care in the ED.  Some ED evaluations and interventions may be delayed as a result of limited staffing during the pandemic.*     Medical Decision Making: 34 year old female here with left lower quadrant abdominal pain.  Suspect possible viral illness versus food borne illness.  Her abdomen is tender but she has no rebound or guarding.  CT scan obtained and shows no evidence of acute abnormality.  Specifically, no evidence of stone, cholecystitis or gallstones, or diverticulitis.  Lab work is unremarkable with normal CBC and white blood cell count.  CMP is unremarkable with no evidence of transaminitis or elevated bilirubin.  Lipase normal.  Urinalysis is without signs of UTI.  She has hematuria which is likely due to her menstrual period and no stones are noted.  She feels better after fluids and antiemetics.  Will treat symptomatically and discharged with outpatient  follow-up.  ____________________________________________  FINAL CLINICAL IMPRESSION(S) / ED DIAGNOSES  Final diagnoses:  Nausea vomiting and diarrhea  Left lower quadrant abdominal pain     MEDICATIONS GIVEN DURING THIS VISIT:  Medications  ondansetron (ZOFRAN-ODT) disintegrating tablet 4 mg (has no administration in time range)  sodium chloride 0.9 % bolus 1,000 mL (1,000 mLs Intravenous New Bag/Given 06/10/20 1319)  ketorolac (TORADOL) 30 MG/ML injection 15 mg (15 mg Intravenous Given 06/10/20 1315)  ondansetron (ZOFRAN) injection 4 mg (4 mg Intravenous Given 06/10/20 1313)  morphine 4 MG/ML injection 6 mg (6 mg Intravenous Given 06/10/20 1314)  prochlorperazine (COMPAZINE) injection 10  mg (10 mg Intravenous Given 06/10/20 1436)  diphenhydrAMINE (BENADRYL) injection 25 mg (25 mg Intravenous Given 06/10/20 1436)     ED Discharge Orders         Ordered    promethazine (PHENERGAN) 25 MG tablet  Every 8 hours PRN        06/10/20 1507    dicyclomine (BENTYL) 10 MG capsule  3 times daily before meals & bedtime        06/10/20 1507    ibuprofen (ADVIL) 600 MG tablet  Every 8 hours PRN        06/10/20 1507           Note:  This document was prepared using Dragon voice recognition software and may include unintentional dictation errors.   Shaune Pollack, MD 06/10/20 1505    Shaune Pollack, MD 06/10/20 [phone]

## 2020-11-01 ENCOUNTER — Ambulatory Visit: Payer: Worker's Compensation | Attending: Orthopaedic Surgery | Admitting: Occupational Therapy

## 2020-11-01 ENCOUNTER — Ambulatory Visit: Payer: Worker's Compensation | Admitting: Physical Therapy

## 2020-11-01 ENCOUNTER — Ambulatory Visit: Payer: Worker's Compensation | Admitting: Occupational Therapy

## 2020-11-01 ENCOUNTER — Other Ambulatory Visit: Payer: Self-pay

## 2020-11-01 ENCOUNTER — Encounter: Payer: Self-pay | Admitting: Occupational Therapy

## 2020-11-01 ENCOUNTER — Encounter: Payer: Self-pay | Admitting: Physical Therapy

## 2020-11-01 DIAGNOSIS — R600 Localized edema: Secondary | ICD-10-CM | POA: Insufficient documentation

## 2020-11-01 DIAGNOSIS — M79645 Pain in left finger(s): Secondary | ICD-10-CM | POA: Diagnosis present

## 2020-11-01 DIAGNOSIS — M25642 Stiffness of left hand, not elsewhere classified: Secondary | ICD-10-CM | POA: Insufficient documentation

## 2020-11-01 DIAGNOSIS — G8929 Other chronic pain: Secondary | ICD-10-CM | POA: Diagnosis present

## 2020-11-01 DIAGNOSIS — M545 Low back pain, unspecified: Secondary | ICD-10-CM | POA: Diagnosis present

## 2020-11-01 DIAGNOSIS — M6281 Muscle weakness (generalized): Secondary | ICD-10-CM | POA: Insufficient documentation
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
## 2020-11-09 ENCOUNTER — Ambulatory Visit: Payer: Self-pay | Admitting: Occupational Therapy

## 2020-11-09 ENCOUNTER — Ambulatory Visit: Payer: Worker's Compensation | Admitting: Physical Therapy

## 2020-11-09 ENCOUNTER — Ambulatory Visit: Payer: Worker's Compensation | Admitting: Occupational Therapy

## 2020-11-11 ENCOUNTER — Encounter: Payer: 59 | Admitting: Physical Therapy

## 2020-11-11 ENCOUNTER — Ambulatory Visit: Payer: Worker's Compensation | Admitting: Occupational Therapy

## 2020-11-16 ENCOUNTER — Ambulatory Visit: Payer: 59 | Admitting: Physical Therapy

## 2020-11-16 ENCOUNTER — Ambulatory Visit: Payer: 59 | Attending: Orthopaedic Surgery | Admitting: Occupational Therapy

## 2020-11-16 ENCOUNTER — Encounter: Payer: Self-pay | Admitting: Physical Therapy

## 2020-11-16 ENCOUNTER — Other Ambulatory Visit: Payer: Self-pay

## 2020-11-16 DIAGNOSIS — R600 Localized edema: Secondary | ICD-10-CM | POA: Diagnosis present

## 2020-11-16 DIAGNOSIS — G8929 Other chronic pain: Secondary | ICD-10-CM

## 2020-11-16 DIAGNOSIS — M6281 Muscle weakness (generalized): Secondary | ICD-10-CM | POA: Diagnosis present

## 2020-11-16 DIAGNOSIS — M79645 Pain in left finger(s): Secondary | ICD-10-CM | POA: Insufficient documentation

## 2020-11-16 DIAGNOSIS — M545 Low back pain, unspecified: Secondary | ICD-10-CM | POA: Diagnosis present

## 2020-11-16 DIAGNOSIS — M25642 Stiffness of left hand, not elsewhere classified: Secondary | ICD-10-CM | POA: Diagnosis present
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
## 2020-11-18 ENCOUNTER — Ambulatory Visit: Payer: Worker's Compensation | Admitting: Occupational Therapy

## 2020-11-18 ENCOUNTER — Ambulatory Visit: Payer: Worker's Compensation | Admitting: Physical Therapy

## 2020-11-30 ENCOUNTER — Ambulatory Visit: Payer: Worker's Compensation | Admitting: Occupational Therapy

## 2020-12-01 ENCOUNTER — Ambulatory Visit: Payer: Worker's Compensation | Admitting: Physical Therapy

## 2020-12-06 ENCOUNTER — Ambulatory Visit: Payer: Worker's Compensation | Admitting: Occupational Therapy

## 2020-12-06 ENCOUNTER — Encounter: Payer: 59 | Admitting: Occupational Therapy

## 2020-12-06 ENCOUNTER — Ambulatory Visit: Payer: Worker's Compensation | Attending: Orthopaedic Surgery | Admitting: Physical Therapy

## 2020-12-06 ENCOUNTER — Encounter: Payer: Self-pay | Admitting: Physical Therapy

## 2020-12-06 ENCOUNTER — Other Ambulatory Visit: Payer: Self-pay

## 2020-12-06 DIAGNOSIS — M6281 Muscle weakness (generalized): Secondary | ICD-10-CM

## 2020-12-06 DIAGNOSIS — G8929 Other chronic pain: Secondary | ICD-10-CM | POA: Diagnosis present

## 2020-12-06 DIAGNOSIS — R600 Localized edema: Secondary | ICD-10-CM | POA: Insufficient documentation

## 2020-12-06 DIAGNOSIS — M79645 Pain in left finger(s): Secondary | ICD-10-CM | POA: Insufficient documentation

## 2020-12-06 DIAGNOSIS — M25642 Stiffness of left hand, not elsewhere classified: Secondary | ICD-10-CM | POA: Insufficient documentation

## 2020-12-06 DIAGNOSIS — M545 Low back pain, unspecified: Secondary | ICD-10-CM | POA: Insufficient documentation
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
## 2020-12-10 ENCOUNTER — Ambulatory Visit: Payer: Worker's Compensation | Admitting: Physical Therapy

## 2020-12-10 ENCOUNTER — Ambulatory Visit: Payer: Worker's Compensation | Admitting: Occupational Therapy

## 2020-12-10 ENCOUNTER — Other Ambulatory Visit: Payer: Self-pay

## 2020-12-10 DIAGNOSIS — M545 Low back pain, unspecified: Secondary | ICD-10-CM | POA: Diagnosis not present

## 2020-12-10 DIAGNOSIS — M79645 Pain in left finger(s): Secondary | ICD-10-CM
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
## 2020-12-13 ENCOUNTER — Ambulatory Visit: Payer: Worker's Compensation | Admitting: Occupational Therapy

## 2020-12-13 ENCOUNTER — Ambulatory Visit: Payer: Worker's Compensation | Admitting: Physical Therapy

## 2020-12-15 ENCOUNTER — Encounter: Payer: 59 | Admitting: Occupational Therapy

## 2020-12-15 ENCOUNTER — Ambulatory Visit: Payer: Worker's Compensation | Admitting: Physical Therapy

## 2020-12-17 ENCOUNTER — Ambulatory Visit: Payer: Worker's Compensation | Admitting: Physical Therapy

## 2020-12-20 ENCOUNTER — Ambulatory Visit: Payer: Worker's Compensation | Admitting: Occupational Therapy

## 2020-12-20 ENCOUNTER — Other Ambulatory Visit: Payer: Self-pay

## 2020-12-20 DIAGNOSIS — M545 Low back pain, unspecified: Secondary | ICD-10-CM | POA: Diagnosis not present

## 2020-12-20 DIAGNOSIS — M6281 Muscle weakness (generalized): Secondary | ICD-10-CM

## 2020-12-20 DIAGNOSIS — M25642 Stiffness of left hand, not elsewhere classified: Secondary | ICD-10-CM

## 2020-12-20 DIAGNOSIS — M79645 Pain in left finger(s): Secondary | ICD-10-CM

## 2020-12-20 DIAGNOSIS — R600 Localized edema: Secondary | ICD-10-CM
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
## 2020-12-23 ENCOUNTER — Encounter: Payer: Self-pay | Admitting: Physical Therapy

## 2020-12-23 ENCOUNTER — Ambulatory Visit: Payer: Worker's Compensation | Admitting: Physical Therapy

## 2020-12-23 DIAGNOSIS — M545 Low back pain, unspecified: Secondary | ICD-10-CM | POA: Diagnosis not present

## 2020-12-23 DIAGNOSIS — G8929 Other chronic pain: Secondary | ICD-10-CM

## 2020-12-23 DIAGNOSIS — M6281 Muscle weakness (generalized): Secondary | ICD-10-CM
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
## 2020-12-28 ENCOUNTER — Ambulatory Visit: Payer: Worker's Compensation | Admitting: Physical Therapy

## 2020-12-30 ENCOUNTER — Ambulatory Visit: Payer: Worker's Compensation | Admitting: Physical Therapy

## 2021-01-04 ENCOUNTER — Ambulatory Visit: Payer: Worker's Compensation | Admitting: Physical Therapy

## 2021-01-10 ENCOUNTER — Ambulatory Visit: Payer: Worker's Compensation | Attending: Orthopaedic Surgery | Admitting: Occupational Therapy

## 2021-01-10 DIAGNOSIS — M6281 Muscle weakness (generalized): Secondary | ICD-10-CM | POA: Diagnosis not present

## 2021-01-10 DIAGNOSIS — M79645 Pain in left finger(s): Secondary | ICD-10-CM | POA: Insufficient documentation

## 2021-01-10 DIAGNOSIS — R600 Localized edema: Secondary | ICD-10-CM | POA: Insufficient documentation

## 2021-01-10 DIAGNOSIS — M25642 Stiffness of left hand, not elsewhere classified: Secondary | ICD-10-CM | POA: Insufficient documentation
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
## 2021-01-14 ENCOUNTER — Encounter: Payer: Self-pay | Admitting: Physical Therapy

## 2021-08-04 ENCOUNTER — Encounter: Payer: Self-pay | Admitting: Gastroenterology

## 2021-08-04 NOTE — H&P (Signed)
Pre-Procedure H&P   Patient ID: Donna Santiago is a 36 y.o. female.  Gastroenterology Provider: [name], DO  Referring Provider: Tawni Pummel, PA PCP: Nira Retort  Date: 08/05/2021  HPI Ms. Donna Santiago is a 36 y.o. female who presents today for Esophagogastroduodenoscopy for gerd, Nausea, vomiting, history of H. pylori infection. Patient with nausea vomiting diarrhea 4-5 times a year without any precipitating factors to her knowledge.  Otherwise has normal BMs 1-2 times daily without hematochezia or melena.  No known trigger foods.  Denies dysphagia and odynophagia.  Uncontrolled diabetes with A1c of 10.3%.  Creatinine 0.8 hemoglobin 14.3 MCV 94 platelets 293,000. H. pylori status posttreatment with negative test in December 2021.  Alpha gal also negative.  Currently on Protonix 40 mg. No other acute GI complaints Father and brother with Barrett's esophagus.  Father had dysplasia requiring ablation.  Past Medical History:  Diagnosis Date   Diabetes mellitus without complication (HCC)    Hypertension    Hypothyroidism    Migraines    Morbid obesity with body mass index (BMI) of 50.0 to 59.9 in adult [facility])     Past Surgical History:  Procedure Laterality Date   DILATION AND CURETTAGE OF UTERUS      Family History Father and brother with Barrett's esophagus.  Father had dysplasia requiring ablation. No other h/o GI disease or malignancy  Review of Systems  Constitutional:  Negative for activity change, appetite change, chills, diaphoresis, fatigue, fever and unexpected weight change.  HENT:  Negative for trouble swallowing and voice change.   Respiratory:  Negative for shortness of breath and wheezing.   Cardiovascular:  Negative for chest pain, palpitations and leg swelling.  Gastrointestinal:  Positive for diarrhea, nausea and vomiting. Negative for abdominal distention, abdominal pain, anal bleeding, blood in stool, constipation and rectal  pain.  Musculoskeletal:  Negative for arthralgias and myalgias.  Skin:  Negative for color change and pallor.  Neurological:  Negative for dizziness, syncope and weakness.  Psychiatric/Behavioral:  Negative for confusion.   All other systems reviewed and are negative.   Medications No current facility-administered medications on file prior to encounter.   Current Outpatient Medications on File Prior to Encounter  Medication Sig Dispense Refill   Dulaglutide (TRULICITY) 3 MG/0.5ML SOPN Inject into the skin.     hydrochlorothiazide (HYDRODIURIL) 25 MG tablet Take 25 mg by mouth daily.     hyoscyamine (LEVSIN SL) 0.125 MG SL tablet Place 0.125 mg under the tongue every 4 (four) hours as needed.     losartan (COZAAR) 100 MG tablet Take 100 mg by mouth daily.     metFORMIN (GLUCOPHAGE) 1000 MG tablet Take 1,000 mg by mouth daily with breakfast.     pantoprazole (PROTONIX) 40 MG tablet Take 40 mg by mouth daily.     dicyclomine (BENTYL) 10 MG capsule Take 1 capsule (10 mg total) by mouth 4 (four) times daily -  before meals and at bedtime for 5 days. 20 capsule 0   diphenhydrAMINE (BENADRYL) 25 mg capsule Take 2 capsules (50 mg total) by mouth every 6 (six) hours as needed. 60 capsule 0   glipiZIDE (GLUCOTROL XL) 5 MG 24 hr tablet Take 1 tablet (5 mg total) by mouth daily. 30 tablet 0   ibuprofen (ADVIL) 600 MG tablet Take 1 tablet (600 mg total) by mouth every 8 (eight) hours as needed for moderate pain. 20 tablet 0   levothyroxine (SYNTHROID, LEVOTHROID) 112 MCG tablet Take 1 tablet by mouth 2 (  two) times daily.      metoCLOPramide (REGLAN) 10 MG tablet Take 1 tablet (10 mg total) by mouth every 6 (six) hours as needed. 30 tablet 0   naproxen (NAPROSYN) 500 MG tablet Take 1 tablet (500 mg total) by mouth 2 (two) times daily with a meal. 20 tablet 0   norethindrone-ethinyl estradiol-iron (JUNEL FE 1.5/30) 1.5-30 MG-MCG tablet Take 1 tablet by mouth daily.     promethazine (PHENERGAN) 25 MG  tablet Take 1 tablet (25 mg total) by mouth every 8 (eight) hours as needed for nausea or vomiting. 15 tablet 0   SUMAtriptan (IMITREX) 100 MG tablet Take 1 tablet (100 mg total) by mouth daily as needed. 10 tablet 0    Pertinent medications related to GI and procedure were reviewed by me with the patient prior to the procedure   Current Facility-Administered Medications:    0.9 %  sodium chloride infusion, , Intravenous, Continuous, [name], DO      Allergies  Allergen Reactions   Amoxicillin Swelling   Penicillins    Prednisone    Allergies were reviewed by me prior to the procedure  Objective    Vitals:   08/05/21 0933  BP: (!) 157/99  Pulse: 69  Resp: 17  Temp: (!) 97.2 F (36.2 C)  TempSrc: Temporal  SpO2: 100%  Weight: (!) 139.3 kg  Height: 5\' 5"  (1.651 m)     Physical Exam Vitals and nursing note reviewed.  Constitutional:      General: She is not in acute distress.    Appearance: Normal appearance. She is obese. She is not ill-appearing, toxic-appearing or diaphoretic.  HENT:     Head: Normocephalic and atraumatic.     Nose: Nose normal.     Mouth/Throat:     Mouth: Mucous membranes are moist.     Pharynx: Oropharynx is clear.  Eyes:     General: No scleral icterus.    Extraocular Movements: Extraocular movements intact.  Cardiovascular:     Rate and Rhythm: Normal rate and regular rhythm.     Heart sounds: Normal heart sounds. No murmur heard.   No friction rub. No gallop.  Pulmonary:     Effort: Pulmonary effort is normal. No respiratory distress.     Breath sounds: Normal breath sounds. No wheezing, rhonchi or rales.  Abdominal:     General: Bowel sounds are normal. There is no distension.     Palpations: Abdomen is soft.     Tenderness: There is no abdominal tenderness. There is no guarding or rebound.  Musculoskeletal:     Cervical back: Neck supple.     Right lower leg: No edema.     Left lower leg: No edema.  Skin:     General: Skin is warm and dry.     Coloration: Skin is not jaundiced or pale.  Neurological:     General: No focal deficit present.     Mental Status: She is alert and oriented to person, place, and time. Mental status is at baseline.  Psychiatric:        Mood and Affect: Mood normal.        Behavior: Behavior normal.        Thought Content: Thought content normal.        Judgment: Judgment normal.     Assessment:  Ms. Donna Santiago is a 36 y.o. female  who presents today for Esophagogastroduodenoscopy and Colonoscopy for gerd, Nausea, vomiting, history of H. pylori infection.  Plan:  Esophagogastroduodenoscopy with possible intervention today  Esophagogastroduodenoscopy with possible biopsy, control of bleeding, polypectomy, and interventions as necessary has been discussed with the patient/patient representative. Informed consent was obtained from the patient/patient representative after explaining the indication, nature, and risks of the procedure including but not limited to death, bleeding, perforation, missed neoplasm/lesions, cardiorespiratory compromise, and reaction to medications. Opportunity for questions was given and appropriate answers were provided. Patient/patient representative has verbalized understanding is amenable to undergoing the procedure.   Jaynie CollinsSteven Michael Lyall Faciane, DO  [facility]  Portions of the record may have been created with voice recognition software. Occasional wrong-word or 'sound-a-like' substitutions may have occurred due to the inherent limitations of voice recognition software.  Read the chart carefully and recognize, using context, where substitutions may have occurred.

## 2021-08-05 ENCOUNTER — Ambulatory Visit: Payer: Commercial Managed Care - HMO | Admitting: Anesthesiology

## 2021-08-05 ENCOUNTER — Ambulatory Visit
Admission: RE | Admit: 2021-08-05 | Discharge: 2021-08-05 | Disposition: A | Discharge status: Home / Self Care | Payer: Commercial Managed Care - HMO | Attending: Gastroenterology | Admitting: Gastroenterology

## 2021-08-05 ENCOUNTER — Encounter
Admission: RE | Disposition: A | Discharge status: Home / Self Care | Payer: Self-pay | Source: Home / Self Care | Attending: Gastroenterology

## 2021-08-05 ENCOUNTER — Encounter: Payer: Self-pay | Admitting: Gastroenterology

## 2021-08-05 DIAGNOSIS — E1165 Type 2 diabetes mellitus with hyperglycemia: Secondary | ICD-10-CM | POA: Diagnosis not present

## 2021-08-05 DIAGNOSIS — R112 Nausea with vomiting, unspecified: Secondary | ICD-10-CM | POA: Diagnosis present

## 2021-08-05 DIAGNOSIS — Z7984 Long term (current) use of oral hypoglycemic drugs: Secondary | ICD-10-CM | POA: Insufficient documentation

## 2021-08-05 DIAGNOSIS — Z79899 Other long term (current) drug therapy: Secondary | ICD-10-CM | POA: Diagnosis not present

## 2021-08-05 DIAGNOSIS — Z7985 Long-term (current) use of injectable non-insulin antidiabetic drugs: Secondary | ICD-10-CM | POA: Diagnosis not present

## 2021-08-05 DIAGNOSIS — Z79890 Hormone replacement therapy (postmenopausal): Secondary | ICD-10-CM | POA: Diagnosis not present

## 2021-08-05 DIAGNOSIS — Z87891 Personal history of nicotine dependence: Secondary | ICD-10-CM | POA: Insufficient documentation

## 2021-08-05 DIAGNOSIS — E039 Hypothyroidism, unspecified: Secondary | ICD-10-CM | POA: Insufficient documentation

## 2021-08-05 DIAGNOSIS — Z6841 Body Mass Index (BMI) 40.0 and over, adult: Secondary | ICD-10-CM | POA: Insufficient documentation

## 2021-08-05 DIAGNOSIS — K319 Disease of stomach and duodenum, unspecified: Secondary | ICD-10-CM | POA: Insufficient documentation

## 2021-08-05 DIAGNOSIS — Z791 Long term (current) use of non-steroidal anti-inflammatories (NSAID): Secondary | ICD-10-CM | POA: Diagnosis not present

## 2021-08-05 DIAGNOSIS — I10 Essential (primary) hypertension: Secondary | ICD-10-CM | POA: Insufficient documentation

## 2021-08-05 DIAGNOSIS — K2970 Gastritis, unspecified, without bleeding: Secondary | ICD-10-CM | POA: Insufficient documentation

## 2021-08-05 HISTORY — DX: Morbid (severe) obesity due to excess calories: E66.01

## 2021-08-05 HISTORY — DX: Hypothyroidism, unspecified: E03.9

## 2021-08-05 HISTORY — DX: Essential (primary) hypertension: I10

## 2021-08-05 HISTORY — PX: ESOPHAGOGASTRODUODENOSCOPY (EGD) WITH PROPOFOL: SHX5813

## 2021-08-05 LAB — GLUCOSE, CAPILLARY: Glucose-Capillary: 191 mg/dL — ABNORMAL HIGH (ref 70–99)

## 2021-08-05 LAB — POCT PREGNANCY, URINE: Preg Test, Ur: NEGATIVE

## 2021-08-05 SURGERY — ESOPHAGOGASTRODUODENOSCOPY (EGD) WITH PROPOFOL
Anesthesia: General

## 2021-08-05 MED ORDER — PROPOFOL 500 MG/50ML IV EMUL
INTRAVENOUS | Status: AC
  Filled 2021-08-05: qty 50

## 2021-08-05 MED ORDER — PROPOFOL 500 MG/50ML IV EMUL
INTRAVENOUS | Status: DC | PRN
  Administered 2021-08-05: 150 ug/kg/min via INTRAVENOUS

## 2021-08-05 MED ORDER — SODIUM CHLORIDE 0.9 % IV SOLN: INTRAVENOUS | Status: DC

## 2021-08-05 NOTE — Transfer of Care (Signed)
Immediate Anesthesia Transfer of Care Note  Patient: Donna Santiago  Procedure(s) Performed: ESOPHAGOGASTRODUODENOSCOPY (EGD) WITH PROPOFOL  Patient Location: PACU  Anesthesia Type:MAC and General  Level of Consciousness: awake and sedated  Airway & Oxygen Therapy: Patient Spontanous Breathing and Patient connected to face mask oxygen  Post-op Assessment: Report given to RN and Post -op Vital signs reviewed and stable  Post vital signs: Reviewed and stable  Last Vitals:  Vitals Value Taken Time  BP    Temp    Pulse    Resp    SpO2      Last Pain:  Vitals:   08/05/21 0933  TempSrc: Temporal  PainSc: 0-No pain         Complications: No notable events documented.

## 2021-08-05 NOTE — Anesthesia Procedure Notes (Signed)
Date/Time: 08/05/2021 10:02 AM Performed by: Tonia Ghent Pre-anesthesia Checklist: Patient identified, Emergency Drugs available, Suction available, Patient being monitored and Timeout performed Patient Re-evaluated:Patient Re-evaluated prior to induction Oxygen Delivery Method: Supernova nasal CPAP Preoxygenation: Pre-oxygenation with 100% oxygen Induction Type: IV induction Airway Equipment and Method: Bite block Placement Confirmation: positive ETCO2 and CO2 detector

## 2021-08-05 NOTE — Anesthesia Postprocedure Evaluation (Signed)
Anesthesia Post Note  Patient: Donna Santiago  Procedure(s) Performed: ESOPHAGOGASTRODUODENOSCOPY (EGD) WITH PROPOFOL  Patient location during evaluation: PACU Anesthesia Type: General Level of consciousness: awake and awake and alert Pain management: pain level controlled Vital Signs Assessment: post-procedure vital signs reviewed and stable Respiratory status: respiratory function stable Cardiovascular status: stable Anesthetic complications: no   No notable events documented.   Last Vitals:  Vitals:   08/05/21 1040 08/05/21 1050  BP: (!) 155/100 (!) 142/105  Pulse: 78 69  Resp: 15 11  Temp:    SpO2: 100% 100%    Last Pain:  Vitals:   08/05/21 1050  TempSrc:   PainSc: 0-No pain                 VAN STAVEREN,Tarah Buboltz

## 2021-08-05 NOTE — Interval H&P Note (Signed)
History and Physical Interval Note: Preprocedure H&P from 08/05/21  was reviewed and there was no interval change after seeing and examining the patient.  Written consent was obtained from the patient after discussion of risks, benefits, and alternatives. Patient has consented to proceed with Esophagogastroduodenoscopy with possible intervention   08/05/2021 9:42 AM  Donna Santiago  has presented today for surgery, with the diagnosis of Diarrhea, nausea, gerd.  The various methods of treatment have been discussed with the patient and family. After consideration of risks, benefits and other options for treatment, the patient has consented to  Procedure(s) with comments: ESOPHAGOGASTRODUODENOSCOPY (EGD) WITH PROPOFOL (N/A) - DM as a surgical intervention.  The patient's history has been reviewed, patient examined, no change in status, stable for surgery.  I have reviewed the patient's chart and labs.  Questions were answered to the patient's satisfaction.     [name]

## 2021-08-05 NOTE — Anesthesia Preprocedure Evaluation (Signed)
Anesthesia Evaluation  Patient identified by MRN, date of birth, ID band Patient awake    Reviewed: Allergy & Precautions, NPO status , Patient's Chart, lab work & pertinent test results  Airway Mallampati: III  TM Distance: >3 FB Neck ROM: full    Dental  (+) Teeth Intact   Pulmonary neg pulmonary ROS, former smoker,    Pulmonary exam normal  + decreased breath sounds      Cardiovascular Exercise Tolerance: Good hypertension, Pt. on medications negative cardio ROS Normal cardiovascular exam Rhythm:Regular     Neuro/Psych  Headaches, negative neurological ROS  negative psych ROS   GI/Hepatic negative GI ROS, Neg liver ROS,   Endo/Other  negative endocrine ROSdiabetes, Well Controlled, Type 2Hypothyroidism Morbid obesity  Renal/GU negative Renal ROS  negative genitourinary   Musculoskeletal negative musculoskeletal ROS (+)   Abdominal (+) + obese,   Peds negative pediatric ROS (+)  Hematology negative hematology ROS (+)   Anesthesia Other Findings Past Medical History: No date: Diabetes mellitus without complication (HCC) No date: Hypertension No date: Hypothyroidism No date: Migraines No date: Morbid obesity with body mass index (BMI) of 50.0 to 59.9 in  adult [facility])  Past Surgical History: No date: DILATION AND CURETTAGE OF UTERUS  BMI    Body Mass Index: 51.09 kg/m      Reproductive/Obstetrics negative OB ROS                             Anesthesia Physical Anesthesia Plan  ASA: 3  Anesthesia Plan: General   Post-op Pain Management:    Induction:   PONV Risk Score and Plan: Propofol infusion and TIVA  Airway Management Planned: Natural Airway and Nasal Cannula  Additional Equipment:   Intra-op Plan:   Post-operative Plan:   Informed Consent: I have reviewed the patients History and Physical, chart, labs and discussed the procedure including the risks, benefits  and alternatives for the proposed anesthesia with the patient or authorized representative who has indicated his/her understanding and acceptance.     Dental Advisory Given  Plan Discussed with: CRNA and Surgeon  Anesthesia Plan Comments:         Anesthesia Quick Evaluation

## 2021-08-05 NOTE — Op Note (Signed)
[facility] Gastroenterology Patient Name: Donna Santiago Procedure Date: 08/05/2021 9:35 AM MRN: [id number] Account #: [id number] Date of Birth: Sep 07, 1985 Admit Type: Outpatient Age: 36 Room: [facility] ENDO ROOM 1 Gender: Female Note Status: Finalized Instrument Name: Upper Endoscope [id number] Procedure:             Upper GI endoscopy Indications:           Nausea with vomiting Providers:             Annamaria Helling DO, DO Medicines:             Monitored Anesthesia Care Complications:         No immediate complications. Estimated blood loss:                         Minimal. Procedure:             Pre-Anesthesia Assessment:                        - Prior to the procedure, a History and Physical was                         performed, and patient medications and allergies were                         reviewed. The patient is competent. The risks and                         benefits of the procedure and the sedation options and                         risks were discussed with the patient. All questions                         were answered and informed consent was obtained.                         Patient identification and proposed procedure were                         verified by the physician, the nurse, the anesthetist                         and the technician in the endoscopy suite. Mental                         Status Examination: alert and oriented. Airway                         Examination: normal oropharyngeal airway and neck                         mobility. Respiratory Examination: clear to                         auscultation. CV Examination: RRR, no murmurs, no S3                         or S4. Prophylactic Antibiotics: The patient does  not                         require prophylactic antibiotics. Prior                         Anticoagulants: The patient has taken no previous                         anticoagulant or antiplatelet agents. ASA Grade                          Assessment: III - A patient with severe systemic                         disease. After reviewing the risks and benefits, the                         patient was deemed in satisfactory condition to                         undergo the procedure. The anesthesia plan was to use                         monitored anesthesia care (MAC). Immediately prior to                         administration of medications, the patient was                         re-assessed for adequacy to receive sedatives. The                         heart rate, respiratory rate, oxygen saturations,                         blood pressure, adequacy of pulmonary ventilation, and                         response to care were monitored throughout the                         procedure. The physical status of the patient was                         re-assessed after the procedure.                        After obtaining informed consent, the endoscope was                         passed under direct vision. Throughout the procedure,                         the patient's blood pressure, pulse, and oxygen                         saturations were monitored continuously. The Endoscope  was introduced through the mouth, and advanced to the                         second part of duodenum. The upper GI endoscopy was                         accomplished without difficulty. The patient tolerated                         the procedure well. Findings:      The duodenal bulb, first portion of the duodenum and second portion of       the duodenum were normal. Biopsies for histology were taken with a cold       forceps for evaluation of celiac disease. Estimated blood loss was       minimal.      Scattered mild inflammation characterized by erosions, erythema and       linear erosions was found in the entire examined stomach. Biopsies were       taken with a cold forceps for Helicobacter pylori  testing. Estimated       blood loss was minimal.      The Z-line was regular. Estimated blood loss: none.      Esophagogastric landmarks were identified: the gastroesophageal junction       was found at 39 cm from the incisors.      The exam of the esophagus was otherwise normal. Impression:            - Normal duodenal bulb, first portion of the duodenum                         and second portion of the duodenum. Biopsied.                        - Gastritis. Biopsied.                        - Z-line regular.                        - Esophagogastric landmarks identified. Recommendation:        - Discharge patient to home.                        - Small, frequent meals.                        - Continue present medications.                        - No aspirin, ibuprofen, naproxen, or other                         non-steroidal anti-inflammatory drugs.                        - Resume taking pantoprazole/protonix as prescribed                         from office.  Recommend gastric emptying study.                        Suspect functional dyspepsia, IBS, and uncontrolled                         Diabetes (A1c 10.3%) as leading components of                         symptoms. Gastroparesis potential, but will need GES                         to determine.                        - Await pathology results.                        - Return to GI clinic as previously scheduled.                        - The findings and recommendations were discussed with                         the patient. Procedure Code(s):     --- Professional ---                        [phone], Esophagogastroduodenoscopy, flexible,                         transoral; with biopsy, single or multiple Diagnosis Code(s):     --- Professional ---                        K29.70, Gastritis, unspecified, without bleeding                        R11.2, Nausea with vomiting, unspecified CPT copyright 2019 American  Medical Association. All rights reserved. The codes documented in this report are preliminary and upon coder review may  be revised to meet current compliance requirements. Attending Participation:      I personally performed the entire procedure. Volney American, DO Annamaria Helling DO, DO 08/05/2021 10:11:30 AM This report has been signed electronically. Number of Addenda: 0 Note Initiated On: 08/05/2021 9:35 AM Estimated Blood Loss:  Estimated blood loss was minimal.      [facility]

## 2021-08-08 ENCOUNTER — Encounter: Payer: Self-pay | Admitting: Gastroenterology

## 2021-08-08 LAB — SURGICAL PATHOLOGY

## 2021-08-29 ENCOUNTER — Other Ambulatory Visit: Payer: Self-pay | Admitting: Gastroenterology

## 2021-08-29 DIAGNOSIS — R1013 Epigastric pain: Secondary | ICD-10-CM

## 2021-08-29 DIAGNOSIS — R110 Nausea: Secondary | ICD-10-CM

## 2021-09-09 ENCOUNTER — Ambulatory Visit: Payer: Managed Care, Other (non HMO)

## 2021-09-09 ENCOUNTER — Other Ambulatory Visit (HOSPITAL_COMMUNITY): Payer: Managed Care, Other (non HMO)

## 2021-09-23 ENCOUNTER — Ambulatory Visit: Payer: Managed Care, Other (non HMO)

## 2021-11-08 ENCOUNTER — Ambulatory Visit: Payer: Worker's Compensation | Admitting: Physical Therapy

## 2021-11-15 ENCOUNTER — Ambulatory Visit: Payer: Worker's Compensation | Admitting: Physical Therapy

## 2021-11-15 ENCOUNTER — Encounter: Payer: Managed Care, Other (non HMO) | Admitting: Physical Therapy

## 2021-11-17 ENCOUNTER — Encounter: Payer: Managed Care, Other (non HMO) | Admitting: Physical Therapy

## 2021-11-30 ENCOUNTER — Telehealth: Payer: Self-pay | Admitting: Physical Therapy

## 2021-11-30 NOTE — Telephone Encounter (Signed)
Called pt to ask if she wanted to come in earlier for eval rather than wait until Monday. She has elected to come in tomorrow morning instead of wait until Monday.

## 2021-12-01 ENCOUNTER — Encounter: Payer: Self-pay | Admitting: Physical Therapy

## 2021-12-01 ENCOUNTER — Ambulatory Visit: Payer: Worker's Compensation | Attending: Physician Assistant | Admitting: Physical Therapy

## 2021-12-01 DIAGNOSIS — R262 Difficulty in walking, not elsewhere classified: Secondary | ICD-10-CM | POA: Insufficient documentation

## 2021-12-01 DIAGNOSIS — M5459 Other low back pain: Secondary | ICD-10-CM | POA: Insufficient documentation

## 2021-12-01 NOTE — Therapy (Signed)
OUTPATIENT PHYSICAL THERAPY THORACOLUMBAR EVALUATION   Patient Name: Donna Santiago MRN: [id number] DOB:1985-09-21, 36 y.o., female Today's Date: 12/01/2021   PT End of Session - 12/01/21 1705     Visit Number 1    Number of Visits 12    Date for PT Re-Evaluation 01/12/22    Authorization Type Workers Comp    Authorization - Visit Number 1    Authorization - Number of Visits 12    Progress Note Due on Visit 6    PT Start Time 0845    PT Stop Time 0930    PT Time Calculation (min) 45 min    Activity Tolerance Patient limited by pain    Behavior During Therapy WFL for tasks assessed/performed             Past Medical History:  Diagnosis Date   Diabetes mellitus without complication (HCC)    Hypertension    Hypothyroidism    Migraines    Morbid obesity with body mass index (BMI) of 50.0 to 59.9 in adult [facility])    Past Surgical History:  Procedure Laterality Date   DILATION AND CURETTAGE OF UTERUS     ESOPHAGOGASTRODUODENOSCOPY (EGD) WITH PROPOFOL N/A 08/05/2021   Procedure: ESOPHAGOGASTRODUODENOSCOPY (EGD) WITH PROPOFOL;  Surgeon: [name], DO;  Location: [facility] ENDOSCOPY;  Service: Gastroenterology;  Laterality: N/A;  DM   There are no problems to display for this patient.   PCP: Not Listed. [facility]   REFERRING PROVIDER: Lavone Nian PA-C   REFERRING DIAG: Low Back Pain   Rationale for Evaluation and Treatment Rehabilitation  THERAPY DIAG:  No diagnosis found.  ONSET DATE: 12/31/20  SUBJECTIVE:                                                                                                                                                                                           SUBJECTIVE STATEMENT: Pt reports that her pain improved after coming last year for PT. However, pain has worsened since receiving a lumbar injection 12/31/20. Pt is highly allergic to steriods and thinks that symptoms in low back are a reaction to the  steroids. She has also received a medial branch block from Dr. Geralyn Flash office at [facility] and Scoliosis, but this did not help.    PERTINENT HISTORY:  See above. No record of incident from provider in chart because EMR not shared.   PAIN:  Are you having pain? Yes: NPRS scale: 6/10 Pain location: Wraps around entire low back  Pain description: Dull  Aggravating factors: Sleeping at night she feels pain spread down left leg especially when laying on left  side  Relieving factors: Laying on stomach    PRECAUTIONS: None  WEIGHT BEARING RESTRICTIONS No  FALLS:  Has patient fallen in last 6 months? No  LIVING ENVIRONMENT: Lives with: lives with their family Lives in: House/apartment Stairs: Yes: External: 2 steps; none Has following equipment at home: None  OCCUPATION: 1st Grade Teacher   PLOF: Independent  PATIENT GOALS Resolve pain that she experiencing    OBJECTIVE:              VITALS: BP 130/87 HR 86 SpO2 100    DIAGNOSTIC FINDINGS:  [facility], pt reports no pinched nerves.   PATIENT SURVEYS:  FOTO 61/62  SCREENING FOR RED FLAGS: Bowel or bladder incontinence: No Spinal tumors: No Cauda equina syndrome: No Compression fracture: No Abdominal aneurysm: No  COGNITION:  Overall cognitive status: Within functional limits for tasks assessed     SENSATION: WFL Intermittent with pain spreading down left leg at night   MUSCLE LENGTH: Hamstrings: Right 60 deg; Left 60 deg Thomas test: Right NT deg; Left NT deg  POSTURE: rounded shoulders  PALPATION: Spinous Process of L1-L5   LUMBAR ROM:   Active  A/PROM  eval  Flexion 80%  Extension 75%*  Right lateral flexion 100%  Left lateral flexion 100%  Right rotation 100%  Left rotation 100%   (Blank rows = not tested) *=Painful   LOWER EXTREMITY ROM:       Active  Right 10/24/2021 Left 10/24/2021  Hip flexion 120 120  Hip extension 20 20  Hip abduction 45 45  Hip adduction 30  30  Hip internal rotation 45 45  Hip external rotation 45 45  Knee flexion 135 135  Knee extension -5 -5  Ankle dorsiflexion 20 20  Ankle plantarflexion 50 50  Ankle inversion    Ankle eversion     (Blank rows = not tested)       LOWER EXTREMITY MMT:    MMT Right eval Left eval  Hip flexion 5 4  Hip extension 3+ 3+  Hip abduction 4 4  Hip adduction 4 4  Hip internal rotation 5 5  Hip external rotation 5 5  Knee flexion 5 5  Knee extension 5 4  Ankle dorsiflexion 5 5  Ankle plantarflexion    Ankle inversion    Ankle eversion     (Blank rows = not tested)   LUMBAR SPECIAL TESTS:  Straight leg raise test: Negative  FUNCTIONAL TESTS:  None   GAIT: Distance walked: 25 ft  Assistive device utilized: None Level of assistance: Complete Independence Comments: Waddle like gait with wide base of support     TODAY'S TREATMENT  Lower Trunk Rotation 3 x 10  Supine HS Stretch 3 x 30 sec    PATIENT EDUCATION:  Education details: form and technique for appropriate exercise. Differentials for low back and hip pathologies and treatment.  Person educated: Patient Education method: Explanation, Demonstration, Verbal cues, and Handouts Education comprehension: verbalized understanding, returned demonstration, and verbal cues required   HOME EXERCISE PROGRAM: Access Code: [email or web address] URL: [email or web address] Date: 12/01/2021 Prepared by: Ellin Goodieaniel Brittin Belnap  Exercises - Supine Lower Trunk Rotation  - 1 x daily - 7 x weekly - 3 sets - 10 reps - Supine Hamstring Stretch with Strap  - 1 x daily - 7 x weekly - 1 sets - 3 reps - 30 hold  ASSESSMENT:  CLINICAL IMPRESSION: Patient is a 36 y.o. white female who was seen today for physical therapy evaluation  and treatment for chronic low back pain. Pain resolved after last bout of PT in 2022, but has since returned after having a joint injection. She exhibits pain predominately with lumbar extension. Her deficits  include decreased hip strength and decreased lumbar extension and pain with lumbar extension. Lumbar radiculopathy ruled out during examination. She will benefit from skilled PT to decrease lumbar pain and increase hip strength to be able to return to standing and bending tasks to perform light cleaning activities as well as teaching tasks.    OBJECTIVE IMPAIRMENTS Abnormal gait, decreased mobility, difficulty walking, decreased strength, impaired flexibility, obesity, and pain.   ACTIVITY LIMITATIONS carrying, lifting, standing, sleeping, dressing, reach over head, hygiene/grooming, and caring for others  PARTICIPATION LIMITATIONS: meal prep, cleaning, laundry, driving, community activity, occupation, and yard work  PERSONAL [place], Past/current experiences, Sex, Time since onset of injury/illness/exacerbation, and 1 comorbidity: Obesity  are also affecting patient's functional outcome.   REHAB POTENTIAL: Fair prior episode treatment for impairment   CLINICAL DECISION MAKING: Stable/uncomplicated  EVALUATION COMPLEXITY: Low   GOALS: Goals reviewed with patient? No  SHORT TERM GOALS: Target date: 12/15/2021  Pt will be independent with HEP in order to improve strength and balance in order to decrease fall risk and improve function at home and work. Baseline: NT  Goal status: INITIAL    LONG TERM GOALS: Target date: 01/26/2022  Patient will have improved function and activity level as evidenced by an increase in FOTO score by 10 points or more. Baseline: 61/62 Goal status: INITIAL  2.  Patient will improve hip and knee strength by 1/2 MMT grade to help offload structures in lower spine for improved activity tolerance and decreased pain response. Baseline: Hip Ext R/L 3+/3+, Hip Add R/L 4/4, Hip Abd 4/4, Hip Flex R 4, Knee Flex R 4  Goal status: INITIAL  3.  Patient will improve lumbar extension AROM to 100% in order to achieve full upright standing posture to be able to  teach without experiencing pain.  Baseline: Lumbar Extension 75% Goal status: INITIAL   PLAN: PT FREQUENCY: 1-2x/week  PT DURATION: 6 weeks  PLANNED INTERVENTIONS: Therapeutic exercises, Therapeutic activity, Neuromuscular re-education, Balance training, Gait training, Patient/Family education, Joint manipulation, Joint mobilization, Stair training, DME instructions, Aquatic Therapy, Dry Needling, Electrical stimulation, Spinal manipulation, Spinal mobilization, Cryotherapy, Moist heat, Traction, Manual therapy, and Re-evaluation.  PLAN FOR NEXT SESSION:  Spinous process joint play, [employer]  Ellin Goodie PT, DPT  12/01/2021, 5:07 PM

## 2021-12-05 ENCOUNTER — Ambulatory Visit: Payer: Worker's Compensation | Admitting: Physical Therapy

## 2021-12-05 ENCOUNTER — Ambulatory Visit: Payer: Worker's Compensation | Attending: Physician Assistant | Admitting: Physical Therapy

## 2021-12-05 ENCOUNTER — Encounter: Payer: Managed Care, Other (non HMO) | Admitting: Physical Therapy

## 2021-12-05 ENCOUNTER — Encounter: Payer: Self-pay | Admitting: Physical Therapy

## 2021-12-05 DIAGNOSIS — R262 Difficulty in walking, not elsewhere classified: Secondary | ICD-10-CM | POA: Diagnosis present

## 2021-12-05 DIAGNOSIS — M6281 Muscle weakness (generalized): Secondary | ICD-10-CM | POA: Diagnosis present

## 2021-12-05 DIAGNOSIS — M5459 Other low back pain: Secondary | ICD-10-CM

## 2021-12-05 DIAGNOSIS — R202 Paresthesia of skin: Secondary | ICD-10-CM | POA: Insufficient documentation

## 2021-12-05 DIAGNOSIS — R200 Anesthesia of skin: Secondary | ICD-10-CM | POA: Insufficient documentation

## 2021-12-05 DIAGNOSIS — G5602 Carpal tunnel syndrome, left upper limb: Secondary | ICD-10-CM | POA: Insufficient documentation

## 2021-12-05 NOTE — Therapy (Signed)
OUTPATIENT PHYSICAL THERAPY TREATMENT NOTE   Patient Name: Donna Santiago MRN: [id number] DOB:01-21-1986, 36 y.o., female Today's Date: 12/05/2021  PCP: Nira Retort  REFERRING PROVIDER: Lavone Nian PA-C   END OF SESSION:   PT End of Session - 12/05/21 1111     Visit Number 2    Number of Visits 12    Date for PT Re-Evaluation 01/12/22    Authorization Type Workers Comp    Authorization - Visit Number 2    Authorization - Number of Visits 12    Progress Note Due on Visit 6    PT Start Time 1100    PT Stop Time 1145    PT Time Calculation (min) 45 min    Equipment Utilized During Treatment Gait belt    Activity Tolerance Patient limited by pain    Behavior During Therapy WFL for tasks assessed/performed             Past Medical History:  Diagnosis Date   Diabetes mellitus without complication (HCC)    Hypertension    Hypothyroidism    Migraines    Morbid obesity with body mass index (BMI) of 50.0 to 59.9 in adult [facility])    Past Surgical History:  Procedure Laterality Date   DILATION AND CURETTAGE OF UTERUS     ESOPHAGOGASTRODUODENOSCOPY (EGD) WITH PROPOFOL N/A 08/05/2021   Procedure: ESOPHAGOGASTRODUODENOSCOPY (EGD) WITH PROPOFOL;  Surgeon: [name], DO;  Location: [facility] ENDOSCOPY;  Service: Gastroenterology;  Laterality: N/A;  DM   There are no problems to display for this patient.   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 No record of incident from provider in chart because EMR not shared.   PRECAUTIONS: None   SUBJECTIVE: Pt reports ongoing pain in her left sided low back pain, but she was able to perform all exercises without an increase in her pain.   PAIN:  Are you having pain? Yes: NPRS scale: 4/10 Pain  location: Left sided low back pain  Pain description: Achy Aggravating factors: Walking and moving  Relieving factors: Not moving    OBJECTIVE: (objective measures completed at initial evaluation unless otherwise dated)  OBJECTIVE:              VITALS: BP 130/87 HR 86 SpO2 100     DIAGNOSTIC FINDINGS:  MRI taken at [facility], pt reports no pinched nerves.    PATIENT SURVEYS:  FOTO 61/62   SCREENING FOR RED FLAGS: Bowel or bladder incontinence: No Spinal tumors: No Cauda equina syndrome: No Compression fracture: No Abdominal aneurysm: No   COGNITION:           Overall cognitive status: Within functional limits for tasks assessed                          SENSATION: WFL Intermittent with pain spreading down left leg at night    MUSCLE LENGTH: Hamstrings: Right 60 deg; Left 60 deg Thomas test: Right NT deg; Left NT deg   POSTURE: rounded shoulders   PALPATION: Spinous Process of L1-L5    LUMBAR ROM:    Active  A/PROM  eval  Flexion 80%  Extension 75%*  Right lateral flexion 100%  Left lateral flexion 100%  Right rotation 100%  Left rotation 100%   (Blank rows =  not tested) *=Painful    LOWER EXTREMITY ROM:          Active  Right 10/24/2021 Left 10/24/2021  Hip flexion 120 120  Hip extension 20 20  Hip abduction 45 45  Hip adduction 30 30  Hip internal rotation 45 45  Hip external rotation 45 45  Knee flexion 135 135  Knee extension -5 -5  Ankle dorsiflexion 20 20  Ankle plantarflexion 50 50  Ankle inversion      Ankle eversion       (Blank rows = not tested)            LOWER EXTREMITY MMT:     MMT Right eval Left eval  Hip flexion 5 4  Hip extension 3+ 3+  Hip abduction 4 4  Hip adduction 4 4  Hip internal rotation 5 5  Hip external rotation 5 5  Knee flexion 5 5  Knee extension 5 4  Ankle dorsiflexion 5 5  Ankle plantarflexion      Ankle inversion      Ankle eversion       (Blank rows = not tested)     LUMBAR SPECIAL  TESTS:  Straight leg raise test: Negative   FUNCTIONAL TESTS:  None    GAIT: Distance walked: 25 ft  Assistive device utilized: None Level of assistance: Complete Independence Comments: Waddle like gait with wide base of support        TODAY'S TREATMENT   12/05/21 Nu-Step Seat level 7 and Arm 7 - 7 min  Lower Trunk Rotations 3 x 10   Thomas Test: + Bilateral   Modified Thomas Stretch with strap for additional quad stretch 3 x 30 sec  -min VC for sequence of exercise and setup   Supine Bridges with Resistance- Red TB  3 x 10   Supine HS Stretch 3 x 30 sec   Initial Eval: Lower Trunk Rotation 3 x 10  Supine HS Stretch 3 x 30 sec      PATIENT EDUCATION:  Education details: form and technique for appropriate exercise. Differentials for low back and hip pathologies and treatment.  Person educated: Patient Education method: Explanation, Demonstration, Verbal cues, and Handouts Education comprehension: verbalized understanding, returned demonstration, and verbal cues required     HOME EXERCISE PROGRAM: Access Code: [email or web address] URL: [email or web address] Date: 12/05/2021 Prepared by: Ellin Goodie  Exercises - Supine Lower Trunk Rotation  - 1 x daily - 7 x weekly - 3 sets - 10 reps - Supine Hamstring Stretch with Strap  - 1 x daily - 7 x weekly - 1 sets - 3 reps - 30 hold - Modified Thomas Stretch  - 1 x daily - 7 x weekly - 1 sets - 3 reps - 30 hold - Supine Bridge with Resistance Band  - 1 x daily - 3 x weekly - 3 sets - 10 reps    ASSESSMENT:   CLINICAL IMPRESSION: Pt shows no increase in her baseline pain after performing hip strengthening and mobility exercises. She required min VC and TC to perform exercises. Left hip has greater muscular tension than right hip as noted during modified thomas and hs stretch. She will continue to benefit from skilled PT to decrease lumbar pain and increase hip strength to be able to return to standing and bending tasks  to perform light cleaning activities as well as teaching tasks.     OBJECTIVE IMPAIRMENTS Abnormal gait, decreased mobility, difficulty walking, decreased strength, impaired flexibility, obesity, and pain.  ACTIVITY LIMITATIONS carrying, lifting, standing, sleeping, dressing, reach over head, hygiene/grooming, and caring for others   PARTICIPATION LIMITATIONS: meal prep, cleaning, laundry, driving, community activity, occupation, and yard work   PERSONAL [employer] Fitness, Past/current experiences, Sex, Time since onset of injury/illness/exacerbation, and 1 comorbidity: Obesity  are also affecting patient's functional outcome.    REHAB POTENTIAL: Fair prior episode treatment for impairment    CLINICAL DECISION MAKING: Stable/uncomplicated   EVALUATION COMPLEXITY: Low     GOALS: Goals reviewed with patient? No   SHORT TERM GOALS: Target date: 12/15/2021   Pt will be independent with HEP in order to improve strength and balance in order to decrease fall risk and improve function at home and work. Baseline: NT  Goal status: INITIAL       LONG TERM GOALS: Target date: 01/26/2022   Patient will have improved function and activity level as evidenced by an increase in FOTO score by 10 points or more. Baseline: 61/62 Goal status: INITIAL   2.  Patient will improve hip and knee strength by 1/2 MMT grade to help offload structures in lower spine for improved activity tolerance and decreased pain response. Baseline: Hip Ext R/L 3+/3+, Hip Add R/L 4/4, Hip Abd 4/4, Hip Flex R 4, Knee Flex R 4  Goal status: INITIAL   3.  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  Baseline: Lumbar Extension 75% Goal status: INITIAL     PLAN: PT FREQUENCY: 1-2x/week   PT DURATION: 6 weeks   PLANNED INTERVENTIONS: Therapeutic exercises, Therapeutic activity, Neuromuscular re-education, Balance  training, Gait training, Patient/Family education, Joint manipulation, Joint mobilization, Stair training, DME instructions, Aquatic Therapy, Dry Needling, Electrical stimulation, Spinal manipulation, Spinal mobilization, Cryotherapy, Moist heat, Traction, Manual therapy, and Re-evaluation.   PLAN FOR NEXT SESSION:  Spinous process joint play, gait analysis     Ellin Goodieaniel Matteus Mcnelly PT, DPT  12/05/2021, 11:49 AM

## 2021-12-07 ENCOUNTER — Encounter: Payer: Self-pay | Admitting: Physical Therapy

## 2021-12-07 ENCOUNTER — Ambulatory Visit: Payer: Worker's Compensation | Admitting: Physical Therapy

## 2021-12-07 DIAGNOSIS — M5459 Other low back pain: Secondary | ICD-10-CM | POA: Diagnosis not present

## 2021-12-07 DIAGNOSIS — R262 Difficulty in walking, not elsewhere classified: Secondary | ICD-10-CM

## 2021-12-07 NOTE — Therapy (Signed)
OUTPATIENT PHYSICAL THERAPY TREATMENT NOTE   Patient Name: Donna Santiago MRN: [id number] DOB:06/06/1986, 36 y.o., female Today's Date: 12/07/2021  PCP: Nira Retort  REFERRING PROVIDER: Lavone Nian PA-C   END OF SESSION:   PT End of Session - 12/07/21 1157     Visit Number 3    Number of Visits 12    Date for PT Re-Evaluation 01/12/22    Authorization Type [employer]    Authorization - Visit Number 3    Authorization - Number of Visits 12    Progress Note Due on Visit 6    PT Start Time 1150    PT Stop Time 1230    PT Time Calculation (min) 40 min    Equipment Utilized During Treatment Gait belt    Activity Tolerance Patient limited by pain    Behavior During Therapy WFL for tasks assessed/performed             Past Medical History:  Diagnosis Date   Diabetes mellitus without complication (HCC)    Hypertension    Hypothyroidism    Migraines    Morbid obesity with body mass index (BMI) of 50.0 to 59.9 in adult [facility])    Past Surgical History:  Procedure Laterality Date   DILATION AND CURETTAGE OF UTERUS     ESOPHAGOGASTRODUODENOSCOPY (EGD) WITH PROPOFOL N/A 08/05/2021   Procedure: ESOPHAGOGASTRODUODENOSCOPY (EGD) WITH PROPOFOL;  Surgeon: [name], DO;  Location: [facility] ENDOSCOPY;  Service: Gastroenterology;  Laterality: N/A;  DM   There are no problems to display for this patient.   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 No record of incident from provider in chart because EMR not shared.   PRECAUTIONS: None   SUBJECTIVE: Pt reports increased back pain from last session, and she is not sure why. She is also feeling it on the mid part of her back.   PAIN:  Are you having pain? Yes: NPRS scale: 6/10 Pain location:  Left sided low back pain  Pain description: Achy Aggravating factors: Walking and moving  Relieving factors: Not moving    OBJECTIVE: (objective measures completed at initial evaluation unless otherwise dated)  OBJECTIVE:              VITALS: BP 130/87 HR 86 SpO2 100     DIAGNOSTIC FINDINGS:  MRI taken at [facility], pt reports no pinched nerves.    PATIENT SURVEYS:  FOTO 61/62   SCREENING FOR RED FLAGS: Bowel or bladder incontinence: No Spinal tumors: No Cauda equina syndrome: No Compression fracture: No Abdominal aneurysm: No   COGNITION:           Overall cognitive status: Within functional limits for tasks assessed                          SENSATION: WFL Intermittent with pain spreading down left leg at night    MUSCLE LENGTH: Hamstrings: Right 60 deg; Left 60 deg Thomas test: Right NT deg; Left NT deg   POSTURE: rounded shoulders   PALPATION: Spinous Process of L1-L5    LUMBAR ROM:    Active  A/PROM  eval  Flexion 80%  Extension 75%*  Right lateral flexion 100%  Left lateral flexion 100%  Right rotation 100%  Left rotation 100%   (Blank  rows = not tested) *=Painful    LOWER EXTREMITY ROM:          Active  Right 10/24/2021 Left 10/24/2021  Hip flexion 120 120  Hip extension 20 20  Hip abduction 45 45  Hip adduction 30 30  Hip internal rotation 45 45  Hip external rotation 45 45  Knee flexion 135 135  Knee extension -5 -5  Ankle dorsiflexion 20 20  Ankle plantarflexion 50 50  Ankle inversion      Ankle eversion       (Blank rows = not tested)            LOWER EXTREMITY MMT:     MMT Right eval Left eval  Hip flexion 5 4  Hip extension 3+ 3+  Hip abduction 4 4  Hip adduction 4 4  Hip internal rotation 5 5  Hip external rotation 5 5  Knee flexion 5 5  Knee extension 5 4  Ankle dorsiflexion 5 5  Ankle plantarflexion      Ankle inversion      Ankle eversion       (Blank rows = not tested)     LUMBAR SPECIAL TESTS:   Straight leg raise test: Negative   FUNCTIONAL TESTS:  None    GAIT: Distance walked: 25 ft  Assistive device utilized: None Level of assistance: Complete Independence Comments: Waddle like gait with wide base of support        TODAY'S TREATMENT  12/07/21 Nu-Step Seat Level 6 and Arm 6- 5 min   Supine Lower Trunk Rotation 3 x 10  SLR 1 x 10 with #2 AW on RLE   SLR 1 x 10 with #4 AW on RLE  -Pt reports increased RLE pain that is spreading down her leg    Supine Abdominal Crunches  3 x 10  Standing Marches with BUE support 3 x 10   Bridges with hip abduction into Red TB 3 x 10   12/05/21 Nu-Step Seat level 7 and Arm 7 - 7 min  Lower Trunk Rotations 3 x 10   Thomas Test: + Bilateral   Modified Thomas Stretch with strap for additional quad stretch 3 x 30 sec  -min VC for sequence of exercise and setup   Supine Bridges with Resistance- Red TB  3 x 10   Supine HS Stretch 3 x 30 sec   Grade II and III firm end feel at L3-L4 vertebrae   Initial Eval: Lower Trunk Rotation 3 x 10  Supine HS Stretch 3 x 30 sec      PATIENT EDUCATION:  Education details: form and technique for appropriate exercise. Differentials for low back and hip pathologies and treatment.  Person educated: Patient Education method: Explanation, Demonstration, Verbal cues, and Handouts Education comprehension: verbalized understanding, returned demonstration, and verbal cues required     HOME EXERCISE PROGRAM: Access Code: [email or web address] URL: [email or web address] Date: 12/07/2021 Prepared by: Ellin Goodie  Exercises - Supine Lower Trunk Rotation  - 1 x daily - 7 x weekly - 3 sets - 10 reps - Supine Hamstring Stretch with Strap  - 1 x daily - 7 x weekly - 1 sets - 3 reps - 30 hold - Modified Thomas Stretch  - 1 x daily - 7 x weekly - 1 sets - 3 reps - 30 hold - Supine Bridge with Resistance Band  - 1 x daily - 3 x weekly - 3 sets - 10 reps - Supine Posterior Pelvic Tilt  - 1  x daily  - 3 x weekly - 3 sets - 10 reps - Standing March with Counter Support  - 1 x daily - 3 x weekly - 3 sets - 10 reps   ASSESSMENT:   CLINICAL IMPRESSION: Pt exhibits continued improvement with pain response to activity with ability to perform all exercises without an increase in her pain with the exception of the SLR. She actually showed a decrease in her low back pain after performing exercises further indicating a likely spinal arthritis pathology. 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   OBJECTIVE IMPAIRMENTS Abnormal gait, decreased mobility, difficulty walking, decreased strength, impaired flexibility, obesity, and pain.    ACTIVITY LIMITATIONS carrying, lifting, standing, sleeping, dressing, reach over head, hygiene/grooming, and caring for others   PARTICIPATION LIMITATIONS: meal prep, cleaning, laundry, driving, community activity, occupation, and yard work   PERSONAL LandACTORS Fitness, Past/current experiences, Sex, Time since onset of injury/illness/exacerbation, and 1 comorbidity: Obesity  are also affecting patient's functional outcome.    REHAB POTENTIAL: Fair prior episode treatment for impairment    CLINICAL DECISION MAKING: Stable/uncomplicated   EVALUATION COMPLEXITY: Low     GOALS: Goals reviewed with patient? No   SHORT TERM GOALS: Target date: 12/15/2021   Pt will be independent with HEP in order to improve strength and balance in order to decrease fall risk and improve function at home and work. Baseline: NT  Goal status: INITIAL       LONG TERM GOALS: Target date: 01/26/2022   Patient will have improved function and activity level as evidenced by an increase in FOTO score by 10 points or more. Baseline: 61/62 Goal status: INITIAL   2.  Patient will improve hip and knee strength by 1/2 MMT  grade to help offload structures in lower spine for improved activity tolerance and decreased pain response. Baseline: Hip Ext R/L 3+/3+, Hip Add R/L 4/4, Hip Abd 4/4, Hip Flex R 4, Knee Flex R 4  Goal status: INITIAL   3.  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  Baseline: Lumbar Extension 75% Goal status: INITIAL     PLAN: PT FREQUENCY: 1-2x/week   PT DURATION: 6 weeks   PLANNED INTERVENTIONS: Therapeutic exercises, Therapeutic activity, Neuromuscular re-education, Balance training, Gait training, Patient/Family education, Joint manipulation, Joint mobilization, Stair training, DME instructions, Aquatic Therapy, Dry Needling, Electrical stimulation, Spinal manipulation, Spinal mobilization, Cryotherapy, Moist heat, Traction, Manual therapy, and Re-evaluation.   PLAN FOR NEXT SESSION:  Spinous process joint play, gait analysis     Ellin Goodieaniel Areil Ottey PT, DPT  12/07/2021, 11:58 AM

## 2021-12-08 ENCOUNTER — Encounter: Payer: Managed Care, Other (non HMO) | Admitting: Physical Therapy

## 2021-12-12 ENCOUNTER — Encounter: Payer: Managed Care, Other (non HMO) | Admitting: Physical Therapy

## 2021-12-14 ENCOUNTER — Encounter: Payer: Commercial Managed Care - HMO | Admitting: Physical Therapy

## 2021-12-15 ENCOUNTER — Encounter: Payer: Managed Care, Other (non HMO) | Admitting: Physical Therapy

## 2021-12-15 ENCOUNTER — Encounter: Payer: Commercial Managed Care - HMO | Admitting: Physical Therapy

## 2021-12-19 ENCOUNTER — Encounter: Payer: Managed Care, Other (non HMO) | Admitting: Physical Therapy

## 2021-12-22 ENCOUNTER — Encounter: Payer: Managed Care, Other (non HMO) | Admitting: Physical Therapy

## 2021-12-26 ENCOUNTER — Encounter: Payer: Managed Care, Other (non HMO) | Admitting: Physical Therapy

## 2021-12-29 ENCOUNTER — Ambulatory Visit: Payer: Worker's Compensation | Admitting: Physical Therapy

## 2021-12-29 ENCOUNTER — Encounter: Payer: Managed Care, Other (non HMO) | Admitting: Physical Therapy

## 2021-12-29 ENCOUNTER — Encounter: Payer: Self-pay | Admitting: Physical Therapy

## 2021-12-29 DIAGNOSIS — R262 Difficulty in walking, not elsewhere classified: Secondary | ICD-10-CM

## 2021-12-29 DIAGNOSIS — M5459 Other low back pain: Secondary | ICD-10-CM | POA: Diagnosis not present

## 2021-12-29 NOTE — Therapy (Signed)
OUTPATIENT PHYSICAL THERAPY TREATMENT NOTE   Patient Name: Donna Santiago MRN: [id number] DOB:09/27/1985, 36 y.o., female Today's Date: 12/29/2021  PCP: Ellene Route  REFERRING PROVIDER: Ignacia Bayley PA-C   END OF SESSION:   PT End of Session - 12/29/21 0935     Visit Number 3    Number of Visits 12    Date for PT Re-Evaluation 01/12/22    Authorization Type Workers Comp    Authorization - Visit Number 3    Authorization - Number of Visits 12    Progress Note Due on Visit 6    Equipment Utilized During Treatment Gait belt    Activity Tolerance Patient limited by pain    Behavior During Therapy WFL for tasks assessed/performed             Past Medical History:  Diagnosis Date   Diabetes mellitus without complication ([place])    Hypertension    Hypothyroidism    Migraines    Morbid obesity with body mass index (BMI) of 50.0 to 59.9 in adult [facility])    Past Surgical History:  Procedure Laterality Date   DILATION AND CURETTAGE OF UTERUS     ESOPHAGOGASTRODUODENOSCOPY (EGD) WITH PROPOFOL N/A 08/05/2021   Procedure: ESOPHAGOGASTRODUODENOSCOPY (EGD) WITH PROPOFOL;  Surgeon: Annamaria Helling, DO;  Location: [facility] ENDOSCOPY;  Service: Gastroenterology;  Laterality: N/A;  DM   There are no problems to display for this patient.   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 No record of incident from provider in chart because EMR not shared.   PRECAUTIONS: None   SUBJECTIVE: Pt reports having many family issues that have prevented her from coming PT. It has been hard for her to do exercises given all her family responsibilities.    Pt reports increased back pain from last session, and she is not sure why. She is also feeling it on the mid part of  her back.   PAIN:  Are you having pain? Yes: NPRS scale: 3/10 Pain location: Left sided low back pain  Pain description: Achy Aggravating factors: Walking and moving  Relieving factors: Not moving    OBJECTIVE: (objective measures completed at initial evaluation unless otherwise dated)  OBJECTIVE:              VITALS: BP 130/87 HR 86 SpO2 100     DIAGNOSTIC FINDINGS:  MRI taken at [facility], pt reports no pinched nerves.    PATIENT SURVEYS:  FOTO 61/62   SCREENING FOR RED FLAGS: Bowel or bladder incontinence: No Spinal tumors: No Cauda equina syndrome: No Compression fracture: No Abdominal aneurysm: No   COGNITION:           Overall cognitive status: Within functional limits for tasks assessed                          SENSATION: WFL Intermittent with pain spreading down left leg at night    MUSCLE LENGTH: Hamstrings: Right 60 deg; Left 60 deg Thomas test: Right NT deg; Left NT deg   POSTURE: rounded shoulders   PALPATION: Spinous Process of L1-L5    LUMBAR ROM:    Active  A/PROM  eval  Flexion 80%  Extension 75%*  Right lateral flexion 100%  Left lateral flexion 100%  Right rotation 100%  Left rotation 100%   (Blank rows = not tested) *=Painful    LOWER EXTREMITY ROM:          Active  Right 10/24/2021 Left 10/24/2021  Hip flexion 120 120  Hip extension 20 20  Hip abduction 45 45  Hip adduction 30 30  Hip internal rotation 45 45  Hip external rotation 45 45  Knee flexion 135 135  Knee extension -5 -5  Ankle dorsiflexion 20 20  Ankle plantarflexion 50 50  Ankle inversion      Ankle eversion       (Blank rows = not tested)            LOWER EXTREMITY MMT:     MMT Right eval Left eval  Hip flexion 5 4  Hip extension 3+ 3+  Hip abduction 4 4  Hip adduction 4 4  Hip internal rotation 5 5  Hip external rotation 5 5  Knee flexion 5 5  Knee extension 5 4  Ankle dorsiflexion 5 5  Ankle plantarflexion      Ankle inversion       Ankle eversion       (Blank rows = not tested)     LUMBAR SPECIAL TESTS:  Straight leg raise test: Negative   FUNCTIONAL TESTS:  None    GAIT: Distance walked: 25 ft  Assistive device utilized: None Level of assistance: Complete Independence Comments: Waddle like gait with wide base of support        TODAY'S TREATMENT   12/29/21            Nu-Step Seat and arm level 10 at resistance level 2                          Lower Trunk Rotation with 3 sec hold  3 x 10                         Seated Posterior Pelvic Tilt with 3 sec hold 3 x 10             - min to mod VC to maintain                         Supine Bridges with Hip Abduction into Red TB 3 x 10                 12/07/21 Nu-Step Seat Level 6 and Arm 6- 5 min   Supine Lower Trunk Rotation 3 x 10  SLR 1 x 10 with #2 AW on RLE   SLR 1 x 10 with #4 AW on RLE  -Pt reports increased RLE pain that is spreading down her leg    Supine Abdominal Crunches  3 x 10  Standing Marches with BUE support 3 x 10   Bridges with hip abduction into Red TB 3 x 10   12/05/21 Nu-Step Seat level 7 and Arm 7 - 7 min  Lower Trunk Rotations 3 x 10   Thomas Test: + Bilateral   Modified Thomas Stretch with strap for additional quad stretch 3 x 30 sec  -min VC for sequence of exercise and setup   Supine Bridges with Resistance- Red TB  3 x 10   Supine HS Stretch 3 x 30 sec   Grade II and III firm end feel at L3-L4 vertebrae      PATIENT EDUCATION:  Education details:  form and technique for appropriate exercise. Differentials for low back and hip pathologies and treatment.  Person educated: Patient Education method: Explanation, Demonstration, Verbal cues, and Handouts Education comprehension: verbalized understanding, returned demonstration, and verbal cues required     HOME EXERCISE PROGRAM: Access Code: [email or web address] URL: [email or web address] Date: 12/07/2021 Prepared by: Ellin Goodie  Exercises - Supine  Lower Trunk Rotation  - 1 x daily - 7 x weekly - 3 sets - 10 reps - Supine Hamstring Stretch with Strap  - 1 x daily - 7 x weekly - 1 sets - 3 reps - 30 hold - Modified Thomas Stretch  - 1 x daily - 7 x weekly - 1 sets - 3 reps - 30 hold - Supine Bridge with Resistance Band  - 1 x daily - 3 x weekly - 3 sets - 10 reps - Supine Posterior Pelvic Tilt  - 1 x daily - 3 x weekly - 3 sets - 10 reps - Standing March with Counter Support  - 1 x daily - 3 x weekly - 3 sets - 10 reps   ASSESSMENT:   CLINICAL IMPRESSION:  Pt returning from long hiatus to PT. Reviewed exercises in HEP and how to perform exercises. Pt able to perform exercises independently and correctly. Min to mod cueing to perform posterior pelvic tilt with pt showing difficulty with neuromuscular control of abdominals. 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   OBJECTIVE IMPAIRMENTS Abnormal gait, decreased mobility, difficulty walking, decreased strength, impaired flexibility, obesity, and pain.    ACTIVITY LIMITATIONS carrying, lifting, standing, sleeping, dressing, reach over head, hygiene/grooming, and caring for others   PARTICIPATION LIMITATIONS: meal prep, cleaning, laundry, driving, community activity, occupation, and yard work   PERSONAL [place], Past/current experiences, Sex, Time since onset of injury/illness/exacerbation, and 1 comorbidity: Obesity  are also affecting patient's functional outcome.    REHAB POTENTIAL: Fair prior episode treatment for impairment    CLINICAL DECISION MAKING: Stable/uncomplicated   EVALUATION COMPLEXITY: Low     GOALS: Goals reviewed with patient? No   SHORT TERM GOALS: Target date: 12/15/2021   Pt will be independent with HEP in order to improve strength and balance in order to decrease fall risk and improve  function at home and work. Baseline: NT  Goal status: INITIAL       LONG TERM GOALS: Target date: 01/26/2022   Patient will have improved function and activity level as evidenced by an increase in FOTO score by 10 points or more. Baseline: 61/62 Goal status: ONGOING   2.  Patient will improve hip and knee strength by 1/2 MMT grade to help offload structures in lower spine for improved activity tolerance and decreased pain response. Baseline: Hip Ext R/L 3+/3+, Hip Add R/L 4/4, Hip Abd 4/4, Hip Flex R 4, Knee Flex R 4  Goal status: ONGOING    3.  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  Baseline: Lumbar Extension 75% Goal status: ONGOING      PLAN: PT FREQUENCY: 1-2x/week   PT DURATION: 6 weeks   PLANNED INTERVENTIONS: Therapeutic exercises, Therapeutic activity, Neuromuscular re-education, Balance training, Gait training, Patient/Family education, Joint manipulation, Joint mobilization, Stair training, DME instructions, Aquatic Therapy, Dry Needling, Electrical stimulation, Spinal manipulation,  Spinal mobilization, Cryotherapy, Moist heat, Traction, Manual therapy, and Re-evaluation.   PLAN FOR NEXT SESSION:  Spinous process joint play, gait analysis, progress hip exercises     Bradly Chris PT, DPT  12/29/2021, 9:36 AM

## 2021-12-30 ENCOUNTER — Encounter: Payer: Self-pay | Admitting: Occupational Therapy

## 2021-12-30 ENCOUNTER — Ambulatory Visit: Payer: Worker's Compensation | Admitting: Occupational Therapy

## 2021-12-30 DIAGNOSIS — G5602 Carpal tunnel syndrome, left upper limb: Secondary | ICD-10-CM

## 2021-12-30 DIAGNOSIS — M5459 Other low back pain: Secondary | ICD-10-CM | POA: Diagnosis not present

## 2021-12-30 DIAGNOSIS — R200 Anesthesia of skin: Secondary | ICD-10-CM

## 2021-12-30 DIAGNOSIS — M6281 Muscle weakness (generalized): Secondary | ICD-10-CM

## 2021-12-30 NOTE — Therapy (Signed)
[place] [facility] [address], [place], [address] Phone: [phone]   Fax:  [phone]  Occupational Therapy Evaluation  Patient Details  Name: Donna Santiago MRN: [id number] Date of Birth: 1986/05/07 Referring Provider (OT): Dr. Posey Pronto   Encounter Date: 12/30/2021   OT End of Session - 12/30/21 1046     Visit Number 1    Number of Visits 8    Date for OT Re-Evaluation 02/24/22    OT Start Time 0935    OT Stop Time 1008    OT Time Calculation (min) 33 min    Activity Tolerance Patient tolerated treatment well    Behavior During Therapy [facility] for tasks assessed/performed             Past Medical History:  Diagnosis Date   Diabetes mellitus without complication ([place])    Hypertension    Hypothyroidism    Migraines    Morbid obesity with body mass index (BMI) of 50.0 to 59.9 in adult [facility]     Past Surgical History:  Procedure Laterality Date   DILATION AND CURETTAGE OF UTERUS     ESOPHAGOGASTRODUODENOSCOPY (EGD) WITH PROPOFOL N/A 08/05/2021   Procedure: ESOPHAGOGASTRODUODENOSCOPY (EGD) WITH PROPOFOL;  Surgeon: Annamaria Helling, DO;  Location: [place];  Service: Gastroenterology;  Laterality: N/A;  DM    There were no vitals filed for this visit.   Subjective Assessment - 12/30/21 1037     Subjective  About 2 months ago I woke up and my left hand was numb and stayed numb the whole day.  Since then my hand goes numb mostly in the morning and then spent some things I do during the day.  They did do a nerve conduction test that showed bilateral moderate carpal tunnel but I have only symptoms of my left.  And then I still have pain in that index finger 2.    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 She  has pain and tenderness at the PIP joint of the left index finger. She has some decreased range of motion. Referto  occupational therapy to work on range of motion. We will provide materials for the patient to buddy tape her left index to her left long finger until follow-up in 4 weeks. A work note was provided with restrictions of no lifting, pushing, pulling greater than 1 -was seen by this OT May through July 2022.  Patient returns now with a new order for carpal tunnel symptoms on the left.    Patient Stated Goals I just wanted to help me with my carpal tunnel symptoms to see if I can get that better the numbness.    Currently in Pain? Yes    Pain Score 3     Pain Location Finger (Comment which one)    Pain Orientation Left    Pain Descriptors / Indicators Aching;Tender;Numbness    Pain Type Acute pain;Chronic pain    Pain Onset More than a month ago    Pain Frequency Intermittent               [facility] OT Assessment - 12/30/21 0001       Assessment   Medical Diagnosis Left carpal tunnel    Referring Provider (OT) Dr. 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 and a foster baby of 6 months      AROM   Left Wrist Extension 70 Degrees   Pull   Left Wrist Flexion 82 Degrees   Tight on volar wrist some discomfort     Strength   Right Hand Grip (lbs) 50    Right Hand Lateral Pinch 17 lbs    Right Hand 3 Point Pinch 12 lbs    Left Hand Grip (lbs) 40    Left Hand Lateral Pinch 10 lbs    Left Hand 3 Point Pinch 6 lbs      Left Hand AROM   L Index  MCP 0-90 80 Degrees    L Index PIP 0-100 95 Degrees   Discomfort or pain over PIP   L Long  MCP 0-90 80 Degrees    L Long PIP 0-100 95 Degrees    L Ring  MCP 0-90 85 Degrees     L Ring PIP 0-100 100 Degrees    L Little  MCP 0-90 85 Degrees    L Little PIP 0-100 95 Degrees                      Patient to do contrast at least twice a day over hand and wrist. Followed by soft tissue massage for metacarpal spreads/carpal spreads as well as webspace.  Patient webspace is tender.  Followed by soft tissue massage to volar left forearm and wrist.  OT this date did Grasston tool #2 for sweeping over volar wrist and forearm prior to gentle passive range of motion for wrist extension. Patient to do light wrist extension stretch 10 reps. Followed by light tendon glides 10 reps. Followed by median nerve glides 5 reps.  Reviewed with patient to focus on when carrying or holding things in large handles and grips.  Pain attention that she does not grip too tight collapsing into wrist flexion. Reviewed use of phone and holding objects decreasing risk for symptoms of carpal tunnel.          OT Education - 12/30/21 1046     Education Details Findings of evaluation and home program    Person(s) Educated Patient    Methods Explanation;Demonstration;Tactile cues;Verbal cues;Handout    Comprehension Verbal cues required;Returned demonstration;Verbalized understanding                 OT Long Term Goals - 12/30/21 1052       OT LONG TERM GOAL #1   Title Patient to be independent in home program to decrease carpal tunnel symptoms to less than 2 times a week.    Baseline Patient with numbness in the morning and randomly during the day.  Happening daily.    Time 3    Period Weeks    Status New    Target Date 01/20/22      OT LONG TERM GOAL #2   Title Patient's carpal tunnel symptoms in the left improved for patient's grip and prehension strength to improve to same numbers than last year.    Baseline Grip her left hand 40 pounds decreased by 17;  prehension strength and decrease by 3 to 4 pounds.-Now 6 for three-point, 10 pounds for lateral.    Time 8     Period Weeks    Status New    Target Date 02/24/22      OT LONG TERM GOAL #3   Title Patient's left wrist flexion extension improved within normal limits now with no increase symptoms to return to prior level of function and no episodes of numbness.    Baseline Left wrist extension 70, flexion 82 with symptoms and discomfort over volar wrist.-Had positive Phalen's    Time 8    Period Weeks    Status New    Target Date 02/24/22                   Plan - 12/30/21 1047     Clinical Impression Statement Patient was seen by this OT last summer after a full at work resulting in a left second digit avulsion fracture.  This date patient return with the new referral for OT for left carpal tunnel symptoms.  Patient report numbness started about 2 months ago during the night and the day stayed numb.  After that is happening on and off in the a.m. as well as with using it.  Patient report nerve conduction test showed moderate carpal tunnel symptoms in bilateral hands but left more symptomatic.  Patient's active range of motion within functional limits but report tightness over volar wrist with wrist extension and flexion.  Patient was negative for Tinel but positive for Phalen's test.  Compared to last year patient's grip and prehension strength decreased in the left hand but increased in the right.  Patient continues to be having some discomfort with composite flexion of second digit.  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  Patient can benefit from skilled OT services to improve functional use of left dominant hand.    OT Occupational Profile and History Problem Focused Assessment - Including review of records relating to presenting problem    Occupational performance deficits (Please refer to evaluation for details): ADL's;IADL's;Play;Leisure;Social Participation    Body  Structure / Function / Physical Skills ADL;Strength;Decreased knowledge of use of DME;Decreased knowledge of precautions;Sensation;Flexibility;ROM;IADL;UE functional use;Pain    Rehab Potential Fair    Clinical Decision Making Limited treatment options, no task modification necessary    Comorbidities Affecting Occupational Performance: None    Modification or Assistance to Complete Evaluation  No modification of tasks or assist necessary to complete eval    OT Frequency 1x / week   increase if symptoms no improve   OT Duration 8 weeks    OT Treatment/Interventions Self-care/ADL training;DME and/or AE instruction;Splinting;Contrast Bath;Passive range of motion;Manual Therapy;Patient/family education    Consulted and Agree with Plan of Care Patient             Patient will benefit from skilled therapeutic intervention in order to improve the following deficits and impairments:   Body Structure / Function / Physical Skills: ADL, Strength, Decreased knowledge of use of DME, Decreased knowledge of precautions, Sensation, Flexibility, ROM, IADL, UE functional use, Pain       Visit Diagnosis: Carpal tunnel syndrome, left upper limb - Plan: Ot plan of care cert/re-cert  Muscle weakness (generalized) - Plan: Ot plan of care cert/re-cert  Numbness and tingling in left hand - Plan: Ot plan of care cert/re-cert  Problem List There are no problems to display for this patient.   Oletta Cohn, OTR/L,CLT 12/30/2021, 10:56 AM  [facility] [facility] [address] [place], [address] Phone: [phone]   Fax:  [phone]  Name: Shahana Capes MRN: [id number] Date of Birth: 1986/01/06

## 2022-01-02 ENCOUNTER — Encounter: Payer: Managed Care, Other (non HMO) | Admitting: Physical Therapy

## 2022-01-04 ENCOUNTER — Ambulatory Visit: Payer: Worker's Compensation | Attending: Physician Assistant | Admitting: Physical Therapy

## 2022-01-04 ENCOUNTER — Encounter: Payer: Self-pay | Admitting: Physical Therapy

## 2022-01-04 DIAGNOSIS — M5459 Other low back pain: Secondary | ICD-10-CM | POA: Insufficient documentation

## 2022-01-04 DIAGNOSIS — R262 Difficulty in walking, not elsewhere classified: Secondary | ICD-10-CM | POA: Insufficient documentation

## 2022-01-04 NOTE — Therapy (Signed)
OUTPATIENT PHYSICAL THERAPY TREATMENT NOTE   Patient Name: Donna Santiago MRN: [id number] DOB:03/27/1986, 36 y.o., female Today's Date: 01/04/2022  PCP: Nira Retort  REFERRING PROVIDER: Lavone Nian PA-C   END OF SESSION:   PT End of Session - 01/04/22 0856     Visit Number 5    Number of Visits 12    Date for PT Re-Evaluation 01/12/22    Authorization Type Workers Comp    Authorization - Visit Number 5    Authorization - Number of Visits 12    Progress Note Due on Visit 6    PT Start Time 0850    PT Stop Time 0930    PT Time Calculation (min) 40 min    Equipment Utilized During Treatment Gait belt    Activity Tolerance Patient tolerated treatment well    Behavior During Therapy WFL for tasks assessed/performed             Past Medical History:  Diagnosis Date   Diabetes mellitus without complication ([facility])    Hypertension    Hypothyroidism    Migraines    Morbid obesity with body mass index (BMI) of 50.0 to 59.9 in adult [facility])    Past Surgical History:  Procedure Laterality Date   DILATION AND CURETTAGE OF UTERUS     ESOPHAGOGASTRODUODENOSCOPY (EGD) WITH PROPOFOL N/A 08/05/2021   Procedure: ESOPHAGOGASTRODUODENOSCOPY (EGD) WITH PROPOFOL;  Surgeon: [name], DO;  Location: [facility] ENDOSCOPY;  Service: Gastroenterology;  Laterality: N/A;  DM   There are no problems to display for this patient.   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 No record of incident from provider in chart because EMR not shared.   PRECAUTIONS: None   SUBJECTIVE: Pt states that she is experiencing increased pain down her left side because of long car ride to [facility].   PAIN:  Are you having pain? Yes: NPRS scale: 7/10 Pain  location: Left sided low back pain  Pain description: Achy Aggravating factors: Walking and moving  Relieving factors: Not moving     OBJECTIVE:              VITALS: BP 130/87 HR 86 SpO2 100     DIAGNOSTIC FINDINGS:  MRI taken at [facility], pt reports no pinched nerves.    PATIENT SURVEYS:  FOTO 61/62   SCREENING FOR RED FLAGS: Bowel or bladder incontinence: No Spinal tumors: No Cauda equina syndrome: No Compression fracture: No Abdominal aneurysm: No   COGNITION:           Overall cognitive status: Within functional limits for tasks assessed                          SENSATION: WFL Intermittent with pain spreading down left leg at night    MUSCLE LENGTH: Hamstrings: Right 60 deg; Left 60 deg Thomas test: Right NT deg; Left NT deg   POSTURE: rounded shoulders   PALPATION: Spinous Process of L1-L5    LUMBAR ROM:    Active  A/PROM  eval  Flexion 80%  Extension 75%*  Right lateral flexion 100%  Left lateral flexion 100%  Right rotation 100%  Left rotation 100%   (Blank rows = not tested) *=Painful    LOWER EXTREMITY ROM:  Active  Right 10/24/2021 Left 10/24/2021  Hip flexion 120 120  Hip extension 20 20  Hip abduction 45 45  Hip adduction 30 30  Hip internal rotation 45 45  Hip external rotation 45 45  Knee flexion 135 135  Knee extension -5 -5  Ankle dorsiflexion 20 20  Ankle plantarflexion 50 50  Ankle inversion      Ankle eversion       (Blank rows = not tested)            LOWER EXTREMITY MMT:     MMT Right eval Left eval  Hip flexion 5 4  Hip extension 3+ 3+  Hip abduction 4 4  Hip adduction 4 4  Hip internal rotation 5 5  Hip external rotation 5 5  Knee flexion 5 5  Knee extension 5 4  Ankle dorsiflexion 5 5  Ankle plantarflexion      Ankle inversion      Ankle eversion       (Blank rows = not tested)     LUMBAR SPECIAL TESTS:  Straight leg raise test: Negative   FUNCTIONAL TESTS:  None    GAIT: Distance  walked: 25 ft  Assistive device utilized: None Level of assistance: Complete Independence Comments: Waddle like gait with wide base of support        TODAY'S TREATMENT   01/04/22  Nu-Step Seat and arm level 10 at resistance level 3  Lower Trunk Rotation 3 x 10  Supine Hip Abduction 2 x 30 sec with PT overpressure  Seated ER Stretch 3 x 30 sec  Supine posterior pelvic tilts with 5 sec tilts x 5             Supine Abdominal Reaches 3 x 10             Standing Hip Abduction 1 x 10             Seated Clam Shell with Blue TB 1 x 10             Supine Clam Shell with Blue TB 1 x 10             Supine Clam Shell with Black TB 3 X 10   12/29/21            Nu-Step Seat and arm level 10 at resistance level 2                          Lower Trunk Rotation with 3 sec hold  3 x 10                         Seated Posterior Pelvic Tilt with 3 sec hold 3 x 10             - min to mod VC to maintain                         Supine Bridges with Hip Abduction into Red TB 3 x 10                 12/07/21 Nu-Step Seat Level 6 and Arm 6- 5 min   Supine Lower Trunk Rotation 3 x 10  SLR 1 x 10 with #2 AW on RLE   SLR 1 x 10 with #4 AW on RLE  -Pt reports increased RLE pain that is spreading down her leg  Supine Abdominal Crunches  3 x 10  Standing Marches with BUE support 3 x 10   Bridges with hip abduction into Red TB 3 x 10     PATIENT EDUCATION:  Education details: form and technique for appropriate exercise. Differentials for low back and hip pathologies and treatment.  Person educated: Patient Education method: Explanation, Demonstration, Verbal cues, and Handouts Education comprehension: verbalized understanding, returned demonstration, and verbal cues required     HOME EXERCISE PROGRAM: Access Code: [email or web address] URL: [email or web address] Date: 01/04/2022 Prepared by: Ellin Goodie  Exercises - Supine Lower Trunk Rotation  - 1 x daily - 7 x weekly - 3 sets - 10  reps - Supine Hamstring Stretch with Strap  - 1 x daily - 7 x weekly - 1 sets - 3 reps - 30 hold - Modified Thomas Stretch  - 1 x daily - 7 x weekly - 1 sets - 3 reps - 30 hold - Supine Bridge with Resistance Band  - 1 x daily - 3 x weekly - 3 sets - 10 reps - Standing March with Counter Support  - 1 x daily - 3 x weekly - 3 sets - 10 reps - Seated Hip External Rotation Stretch  - 1 x daily - 7 x weekly - 1 sets - 3 reps - 30 hold - Curl Up with Reach  - 1 x daily - 3 x weekly - 3 sets - 10 reps - Hooklying Clamshell with Resistance  - 1 x daily - 3 x weekly - 3 sets - 10 reps   ASSESSMENT:   CLINICAL IMPRESSION: Pt exhibit improved abdominal strength and decreased hip abduction strength during session. She was able to perform sit and reach for first time and she demonstrated lateral lean compensation for standing hip abduction exercise indicative of hip abduction weakness. 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     OBJECTIVE IMPAIRMENTS Abnormal gait, decreased mobility, difficulty walking, decreased strength, impaired flexibility, obesity, and pain.    ACTIVITY LIMITATIONS carrying, lifting, standing, sleeping, dressing, reach over head, hygiene/grooming, and caring for others   PARTICIPATION LIMITATIONS: meal prep, cleaning, laundry, driving, community activity, occupation, and yard work   PERSONAL Land, Past/current experiences, Sex, Time since onset of injury/illness/exacerbation, and 1 comorbidity: Obesity  are also affecting patient's functional outcome.    REHAB POTENTIAL: Fair prior episode treatment for impairment    CLINICAL DECISION MAKING: Stable/uncomplicated   EVALUATION COMPLEXITY: Low     GOALS: Goals reviewed with patient? No   SHORT TERM GOALS: Target date: 12/15/2021   Pt will be  independent with HEP in order to improve strength and balance in order to decrease fall risk and improve function at home and work. Baseline: NT  Goal status: INITIAL       LONG TERM GOALS: Target date: 01/26/2022   Patient will have improved function and activity level as evidenced by an increase in FOTO score by 10 points or more. Baseline: 61/62 Goal status: ONGOING   2.  Patient will improve hip and knee strength by 1/2 MMT grade to help offload structures in lower spine for improved activity tolerance and decreased pain response. Baseline: Hip Ext R/L 3+/3+, Hip Add R/L 4/4, Hip Abd 4/4, Hip Flex R 4, Knee Flex R 4  Goal status: ONGOING    3.  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  Baseline: Lumbar Extension 75% Goal status: ONGOING      PLAN: PT FREQUENCY: 1-2x/week   PT DURATION: 6 weeks   PLANNED INTERVENTIONS: Therapeutic exercises, Therapeutic activity, Neuromuscular re-education, Balance training, Gait training, Patient/Family education, Joint manipulation, Joint mobilization, Stair training, DME instructions, Aquatic Therapy, Dry Needling, Electrical stimulation, Spinal manipulation, Spinal mobilization, Cryotherapy, Moist heat, Traction, Manual therapy, and Re-evaluation.   PLAN FOR NEXT SESSION:  Spinous process joint play, gait analysis, progress hip and abdominal exercises     Ellin Goodie PT, DPT  01/04/2022, 9:36 AM

## 2022-01-05 ENCOUNTER — Ambulatory Visit: Payer: Worker's Compensation | Admitting: Occupational Therapy

## 2022-01-05 ENCOUNTER — Ambulatory Visit: Payer: Worker's Compensation | Attending: Physician Assistant | Admitting: Physical Therapy

## 2022-01-05 ENCOUNTER — Encounter: Payer: Self-pay | Admitting: Physical Therapy

## 2022-01-05 DIAGNOSIS — R200 Anesthesia of skin: Secondary | ICD-10-CM | POA: Insufficient documentation

## 2022-01-05 DIAGNOSIS — M25642 Stiffness of left hand, not elsewhere classified: Secondary | ICD-10-CM | POA: Diagnosis present

## 2022-01-05 DIAGNOSIS — G5602 Carpal tunnel syndrome, left upper limb: Secondary | ICD-10-CM | POA: Diagnosis present

## 2022-01-05 DIAGNOSIS — M5459 Other low back pain: Secondary | ICD-10-CM | POA: Diagnosis not present

## 2022-01-05 DIAGNOSIS — M6281 Muscle weakness (generalized): Secondary | ICD-10-CM | POA: Diagnosis present

## 2022-01-05 DIAGNOSIS — R202 Paresthesia of skin: Secondary | ICD-10-CM | POA: Insufficient documentation

## 2022-01-05 DIAGNOSIS — R262 Difficulty in walking, not elsewhere classified: Secondary | ICD-10-CM | POA: Diagnosis present

## 2022-01-05 NOTE — Therapy (Signed)
OUTPATIENT PHYSICAL THERAPY TREATMENT NOTE   Patient Name: Donna Santiago MRN: [id number] DOB:1986/01/09, 36 y.o., female Today's Date: 01/05/2022  PCP: Nira Retort  REFERRING PROVIDER: Lavone Nian PA-C   END OF SESSION:   PT End of Session - 01/05/22 0937     Visit Number 6    Number of Visits 12    Date for PT Re-Evaluation 01/12/22    Authorization Type Workers [employer]    Authorization - Visit Number 6    Authorization - Number of Visits 12    Progress Note Due on Visit 6    PT Start Time 0935    PT Stop Time 1015    PT Time Calculation (min) 40 min    Equipment Utilized During Treatment Gait belt    Activity Tolerance Patient tolerated treatment well    Behavior During Therapy WFL for tasks assessed/performed             Past Medical History:  Diagnosis Date   Diabetes mellitus without complication (HCC)    Hypertension    Hypothyroidism    Migraines    Morbid obesity with body mass index (BMI) of 50.0 to 59.9 in adult [facility])    Past Surgical History:  Procedure Laterality Date   DILATION AND CURETTAGE OF UTERUS     ESOPHAGOGASTRODUODENOSCOPY (EGD) WITH PROPOFOL N/A 08/05/2021   Procedure: ESOPHAGOGASTRODUODENOSCOPY (EGD) WITH PROPOFOL;  Surgeon: [name], DO;  Location: [facility] ENDOSCOPY;  Service: Gastroenterology;  Laterality: N/A;  DM   There are no problems to display for this patient.   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 No record of incident from provider in chart because EMR not shared.   PRECAUTIONS: None   SUBJECTIVE: Pt states that she is experiencing increased pain down her left side because of long car ride to [facility].   PAIN:  Are you having pain? Yes: NPRS scale: 7/10 Pain  location: Left sided low back pain  Pain description: Achy Aggravating factors: Walking and moving  Relieving factors: Not moving     OBJECTIVE:              VITALS: BP 130/87 HR 86 SpO2 100     DIAGNOSTIC FINDINGS:  MRI taken at [facility], pt reports no pinched nerves.    PATIENT SURVEYS:  FOTO 61/62   SCREENING FOR RED FLAGS: Bowel or bladder incontinence: No Spinal tumors: No Cauda equina syndrome: No Compression fracture: No Abdominal aneurysm: No   COGNITION:           Overall cognitive status: Within functional limits for tasks assessed                          SENSATION: WFL Intermittent with pain spreading down left leg at night    MUSCLE LENGTH: Hamstrings: Right 60 deg; Left 60 deg Thomas test: Right NT deg; Left NT deg   POSTURE: rounded shoulders   PALPATION: Spinous Process of L1-L5    LUMBAR ROM:    Active  A/PROM  eval  Flexion 80%  Extension 75%*  Right lateral flexion 100%  Left lateral flexion 100%  Right rotation 100%  Left rotation 100%   (Blank rows = not tested) *=Painful    LOWER EXTREMITY ROM:  Active  Right 10/24/2021 Left 10/24/2021  Hip flexion 120 120  Hip extension 20 20  Hip abduction 45 45  Hip adduction 30 30  Hip internal rotation 45 45  Hip external rotation 45 45  Knee flexion 135 135  Knee extension -5 -5  Ankle dorsiflexion 20 20  Ankle plantarflexion 50 50  Ankle inversion      Ankle eversion       (Blank rows = not tested)            LOWER EXTREMITY MMT:     MMT Right eval Left eval  Hip flexion 5 4  Hip extension 3+ 3+  Hip abduction 4 4  Hip adduction 4 4  Hip internal rotation 5 5  Hip external rotation 5 5  Knee flexion 5 5  Knee extension 5 4  Ankle dorsiflexion 5 5  Ankle plantarflexion      Ankle inversion      Ankle eversion       (Blank rows = not tested)     LUMBAR SPECIAL TESTS:  Straight leg raise test: Negative   FUNCTIONAL TESTS:  None    GAIT: Distance  walked: 25 ft  Assistive device utilized: None Level of assistance: Complete Independence Comments: Waddle like gait with wide base of support        TODAY'S TREATMENT   01/05/22 Nu-Step Seat and Arm Level 11 at resistance level 3  MATRIX Hip Abduction #25 3 x 10 MATRIX Hip Adduction #40 3 x 10  OMEGA Leg Press #75 1 x 10  OMEGA Leg Press #105 2 x 10   Standing Marches with 1 UE support 3 x 10     01/04/22  Nu-Step Seat and arm level 10 at resistance level 3  Lower Trunk Rotation 3 x 10  Supine Hip Abduction 2 x 30 sec with PT overpressure  Seated ER Stretch 3 x 30 sec  Supine posterior pelvic tilts with 5 sec tilts x 5             Supine Abdominal Reaches 3 x 10             Standing Hip Abduction 1 x 10             Seated Clam Shell with Blue TB 1 x 10             Supine Clam Shell with Blue TB 1 x 10             Supine Clam Shell with Black TB 3 X 10   12/29/21            Nu-Step Seat and arm level 10 at resistance level 2                          Lower Trunk Rotation with 3 sec hold  3 x 10                         Seated Posterior Pelvic Tilt with 3 sec hold 3 x 10             - min to mod VC to maintain                         Supine Bridges with Hip Abduction into Red TB 3 x 10  PATIENT EDUCATION:  Education details: form and technique for appropriate exercise. Differentials for low back and hip pathologies and treatment.  Person educated: Patient Education method: Explanation, Demonstration, Verbal cues, and Handouts Education comprehension: verbalized understanding, returned demonstration, and verbal cues required     HOME EXERCISE PROGRAM: Access Code: [email or web address] URL: [email or web address] Date: 01/05/2022 Prepared by: Ellin Goodie  Exercises - Supine Lower Trunk Rotation  - 1 x daily - 7 x weekly - 3 sets - 10 reps - Supine Hamstring Stretch with Strap  - 1 x daily - 7 x weekly - 1 sets - 3 reps - 30 hold - Modified  Thomas Stretch  - 1 x daily - 7 x weekly - 1 sets - 3 reps - 30 hold - Supine Bridge with Resistance Band  - 1 x daily - 3 x weekly - 3 sets - 10 reps - Standing March with Counter Support  - 1 x daily - 3 x weekly - 3 sets - 10 reps - Seated Hip External Rotation Stretch  - 1 x daily - 7 x weekly - 1 sets - 3 reps - 30 hold - Curl Up with Reach  - 1 x daily - 3 x weekly - 3 sets - 10 reps - Hooklying Clamshell with Resistance  - 1 x daily - 3 x weekly - 3 sets - 10 reps   ASSESSMENT:   CLINICAL IMPRESSION: Pt exhibits improved hip strength and no increase in her low back pain while performing exercises with increased resistance. 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      OBJECTIVE IMPAIRMENTS Abnormal gait, decreased mobility, difficulty walking, decreased strength, impaired flexibility, obesity, and pain.    ACTIVITY LIMITATIONS carrying, lifting, standing, sleeping, dressing, reach over head, hygiene/grooming, and caring for others   PARTICIPATION LIMITATIONS: meal prep, cleaning, laundry, driving, community activity, occupation, and yard work   PERSONAL Land, Past/current experiences, Sex, Time since onset of injury/illness/exacerbation, and 1 comorbidity: Obesity  are also affecting patient's functional outcome.    REHAB POTENTIAL: Fair prior episode treatment for impairment    CLINICAL DECISION MAKING: Stable/uncomplicated   EVALUATION COMPLEXITY: Low     GOALS: Goals reviewed with patient? No   SHORT TERM GOALS: Target date: 12/15/2021   Pt will be independent with HEP in order to improve strength and balance in order to decrease fall risk and improve function at home and work. Baseline: NT  Goal status: INITIAL       LONG TERM GOALS: Target date: 01/26/2022   Patient will have improved  function and activity level as evidenced by an increase in FOTO score by 10 points or more. Baseline: 61/62 Goal status: ONGOING   2.  Patient will improve hip and knee strength by 1/2 MMT grade to help offload structures in lower spine for improved activity tolerance and decreased pain response. Baseline: Hip Ext R/L 3+/3+, Hip Add R/L 4/4, Hip Abd 4/4, Hip Flex R 4, Knee Flex R 4  Goal status: ONGOING    3.  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  Baseline: Lumbar Extension 75% Goal status: ONGOING      PLAN: PT FREQUENCY: 1-2x/week   PT DURATION: 6 weeks   PLANNED INTERVENTIONS:  Therapeutic exercises, Therapeutic activity, Neuromuscular re-education, Balance training, Gait training, Patient/Family education, Joint manipulation, Joint mobilization, Stair training, DME instructions, Aquatic Therapy, Dry Needling, Electrical stimulation, Spinal manipulation, Spinal mobilization, Cryotherapy, Moist heat, Traction, Manual therapy, and Re-evaluation.   PLAN FOR NEXT SESSION:  Take FOTO,Aerobic exercise with introduction of RPE, gait analysis, progress hip and abdominal exercises (step ups)    Ellin Goodie PT, DPT  01/05/2022, 9:38 AM

## 2022-01-05 NOTE — Therapy (Signed)
[facility] [address] [place], [address] Phone: [phone]   Fax:  [phone]  Occupational Therapy Treatment  Patient Details  Name: Donna Santiago MRN: [id number] Date of Birth: 10/31/1985 Referring Provider (OT): Dr. Posey Pronto   Encounter Date: 01/05/2022   OT End of Session - 01/05/22 1530     Visit Number 2    Number of Visits 8    Date for OT Re-Evaluation 02/24/22    OT Start Time [id number]    OT Stop Time 1525    OT Time Calculation (min) 40 min    Activity Tolerance Patient tolerated treatment well    Behavior During Therapy [facility] for tasks assessed/performed             Past Medical History:  Diagnosis Date   Diabetes mellitus without complication ([place])    Hypertension    Hypothyroidism    Migraines    Morbid obesity with body mass index (BMI) of 50.0 to 59.9 in adult [facility])     Past Surgical History:  Procedure Laterality Date   DILATION AND CURETTAGE OF UTERUS     ESOPHAGOGASTRODUODENOSCOPY (EGD) WITH PROPOFOL N/A 08/05/2021   Procedure: ESOPHAGOGASTRODUODENOSCOPY (EGD) WITH PROPOFOL;  Surgeon: Annamaria Helling, DO;  Location: [facility];  Service: Gastroenterology;  Laterality: N/A;  DM    There were no vitals filed for this visit.   Subjective Assessment - 01/05/22 1529     Subjective  I went to pick up the brace that the doctor got me.  But this is only fitted on me but this on the same one that he showed me is so bulky.  My finger has been hurting since I seen you last time.    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 She has pain and tenderness at the PIP joint of the left index finger. She has some decreased range of motion. Referto  occupational therapy to work on range of motion.  We will provide materials for the patient to buddy tape her left index to her left long finger until follow-up in 4 weeks. A work note was provided with restrictions of no lifting, pushing, pulling greater than 1 -was seen by this OT May through July 2022.  Patient returns now with a new order for carpal tunnel symptoms on the left.    Patient Stated Goals I just wanted to help me with my carpal tunnel symptoms to see if I can get that better the numbness.    Currently in Pain? Yes    Pain Score 2     Pain Location Finger (Comment which one)    Pain Orientation Left    Pain Descriptors / Indicators Aching;Tightness    Pain Type Acute pain;Chronic pain    Pain Onset More than a month ago    Pain Frequency Intermittent                     Patient arrived with a prefab wrist splint on today.  Appears to be a ulnar gutter splint.  Recommend for her to take it back and ask for a wrist immobilization splint that orthopedics ordered her. Patient to wear wrist splint for 3 weeks most of the time except off for  ADLs and home exercises as well as sleeping on      OT Treatments/Exercises (OP) - 01/05/22 0001       LUE Contrast Bath   Time 8 minutes    Comments Prior to range of motion and soft tissue               Patient to do contrast at least twice a day over hand and wrist. Followed by soft tissue massage for metacarpal spreads/carpal spreads as well as webspace.  Done by OT this date.  Patient with less tenderness in webspace today.  At home family to help her with soft tissue massage to volar left forearm and wrist.  OT this date did Grasston tool #2 for sweeping over volar wrist and forearm prior to gentle passive range of motion for wrist extension. Had less tightness. Patient to do light wrist extension stretch 10 reps. Followed by light tendon glides 10 reps.  For pain and discomfort at second digit. Followed by median nerve glides 5 reps.   Reviewed with patient to  focus on when carrying or holding things in large handles and grips.  Pay attention that she does not grip too tight collapsing into wrist flexion. Reviewed use of phone and holding objects decreasing risk for symptoms of carpal tunnel.       OT Education - 01/05/22 1530     Education Details Progress and changes in home program as well as splint fitting    Person(s) Educated Patient    Methods Explanation;Demonstration;Tactile cues;Verbal cues;Handout    Comprehension Verbal cues required;Returned demonstration;Verbalized understanding                 OT Long Term Goals - 12/30/21 1052       OT LONG TERM GOAL #1   Title Patient to be independent in home program to decrease carpal tunnel symptoms to less than 2 times a week.    Baseline Patient with numbness in the morning and randomly during the day.  Happening daily.    Time 3    Period Weeks    Status New    Target Date 01/20/22      OT LONG TERM GOAL #2   Title Patient's carpal tunnel symptoms in the left improved for patient's grip and prehension strength to improve to same numbers than last year.    Baseline Grip her left hand 40 pounds decreased by 17; prehension strength and decrease by 3 to 4 pounds.-Now 6 for three-point, 10 pounds for lateral.    Time 8    Period Weeks    Status New    Target Date 02/24/22      OT LONG TERM GOAL #3   Title Patient's left wrist flexion extension improved within normal limits now with no increase symptoms to return to prior level of function and no episodes of numbness.    Baseline Left wrist extension 70, flexion 82 with symptoms and discomfort over volar wrist.-Had positive Phalen's    Time 8    Period Weeks    Status New    Target Date 02/24/22                   Plan - 01/05/22 1531     Clinical Impression Statement Patient was seen by this OT last summer after a full at work resulting in a left second digit avulsion fracture. Patient return last week with  the new referral for OT for left carpal tunnel symptoms.  Patient  report numbness started about 2 months ago during the night and the day stayed numb.  After that is happening on and off in the a.m. as well as with using it.  Patient report nerve conduction test showed moderate carpal tunnel symptoms in bilateral hands but left more symptomatic.  Patient's active range of motion within functional limits but report tightness over volar wrist with wrist extension and flexion.  Patient was negative for Tinel but positive for Phalen's test.  Compared to last year patient's grip and prehension strength decreased in the left hand but increased in the right.  Patient continues to be having some discomfort with composite flexion of second digit.  Patient this date arrived with a prefab ulnar gutter splint.  Assess fit.  Recommend for patient to take it back to orthopedics and see if they have a wrist immobilization splint that the orthopedic recommended.  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  Patient can benefit from skilled OT services to improve functional use of left dominant hand.    OT Occupational Profile and History Problem Focused Assessment - Including review of records relating to presenting problem    Occupational performance deficits (Please refer to evaluation for details): ADL's;IADL's;Play;Leisure;Social Participation    Body Structure / Function / Physical Skills ADL;Strength;Decreased knowledge of use of DME;Decreased knowledge of precautions;Sensation;Flexibility;ROM;IADL;UE functional use;Pain    Rehab Potential Fair    Clinical Decision Making Limited treatment options, no task modification necessary    Comorbidities Affecting Occupational Performance: None    Modification or Assistance to Complete Evaluation  No modification of tasks or assist necessary to complete eval     OT Frequency 1x / week    OT Duration 8 weeks    OT Treatment/Interventions Self-care/ADL training;DME and/or AE instruction;Splinting;Contrast Bath;Passive range of motion;Manual Therapy;Patient/family education    Consulted and Agree with Plan of Care Patient             Patient will benefit from skilled therapeutic intervention in order to improve the following deficits and impairments:   Body Structure / Function / Physical Skills: ADL, Strength, Decreased knowledge of use of DME, Decreased knowledge of precautions, Sensation, Flexibility, ROM, IADL, UE functional use, Pain       Visit Diagnosis: Carpal tunnel syndrome, left upper limb  Muscle weakness (generalized)  Numbness and tingling in left hand  Stiffness of left hand, not elsewhere classified    Problem List There are no problems to display for this patient.   Oletta Cohn, OTR/L,CLT 01/05/2022, 3:34 PM  [facility] [address] [place], [address] Phone: [phone]   Fax:  [phone]  Name: Dennice Tindol MRN: [id number] Date of Birth: Feb 14, 1986

## 2022-01-10 ENCOUNTER — Ambulatory Visit: Payer: Worker's Compensation | Admitting: Physical Therapy

## 2022-01-10 ENCOUNTER — Encounter: Payer: Self-pay | Admitting: Physical Therapy

## 2022-01-10 DIAGNOSIS — R262 Difficulty in walking, not elsewhere classified: Secondary | ICD-10-CM

## 2022-01-10 DIAGNOSIS — M5459 Other low back pain: Secondary | ICD-10-CM

## 2022-01-10 NOTE — Therapy (Signed)
OUTPATIENT PHYSICAL THERAPY PROGRESS NOTE   Patient Name: Donna Santiago MRN: [id number] DOB:[birth year], 36 y.o., female Today's Date: 01/10/2022  PCP: Ellene Route  REFERRING PROVIDER: Ignacia Bayley PA-C   END OF SESSION:   PT End of Session - 01/10/22 1023     Visit Number 7    Number of Visits 12    Date for PT Re-Evaluation 01/12/22    Authorization Type Workers Comp    Authorization - Visit Number 7    Authorization - Number of Visits 12    Progress Note Due on Visit 6    PT Start Time 1020    PT Stop Time 1100    PT Time Calculation (min) 40 min    Equipment Utilized During Treatment Gait belt    Activity Tolerance Patient tolerated treatment well    Behavior During Therapy WFL for tasks assessed/performed             Past Medical History:  Diagnosis Date   Diabetes mellitus without complication ([place])    Hypertension    Hypothyroidism    Migraines    Morbid obesity with body mass index (BMI) of 50.0 to 59.9 in adult [facility])    Past Surgical History:  Procedure Laterality Date   DILATION AND CURETTAGE OF UTERUS     ESOPHAGOGASTRODUODENOSCOPY (EGD) WITH PROPOFOL N/A 08/05/2021   Procedure: ESOPHAGOGASTRODUODENOSCOPY (EGD) WITH PROPOFOL;  Surgeon: Annamaria Helling, DO;  Location: [facility] ENDOSCOPY;  Service: Gastroenterology;  Laterality: N/A;  DM   There are no problems to display for this patient.   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 No record of incident from provider in chart because EMR not shared.   PRECAUTIONS: None   SUBJECTIVE: Pt states that she is experiencing increased pain down her left side because of long car ride to [facility].   PAIN:  Are you having pain? Yes: NPRS scale: 7/10 Pain  location: Left sided low back pain  Pain description: Achy Aggravating factors: Walking and moving  Relieving factors: Not moving     OBJECTIVE:              VITALS: BP 130/87 HR 86 SpO2 100     DIAGNOSTIC FINDINGS:  MRI taken at [facility], pt reports no pinched nerves.    PATIENT SURVEYS:  FOTO 61/62 01/10/22: 63    SCREENING FOR RED FLAGS: Bowel or bladder incontinence: No Spinal tumors: No Cauda equina syndrome: No Compression fracture: No Abdominal aneurysm: No   COGNITION:           Overall cognitive status: Within functional limits for tasks assessed                          SENSATION: WFL Intermittent with pain spreading down left leg at night    MUSCLE LENGTH: Hamstrings: Right 60 deg; Left 60 deg Thomas test: Right NT deg; Left NT deg   POSTURE: rounded shoulders   PALPATION: Spinous Process of L1-L5    LUMBAR ROM:    Active  A/PROM  eval AROM  eval   Flexion 80% 90%  Extension 75%* 85%*  Right lateral flexion 100%   Left lateral flexion 100%   Right rotation 100%   Left rotation 100%    (Blank rows =  not tested) *=Painful    LOWER EXTREMITY ROM:          Active  Right 10/24/2021 Left 10/24/2021  Hip flexion 120 120  Hip extension 20 20  Hip abduction 45 45  Hip adduction 30 30  Hip internal rotation 45 45  Hip external rotation 45 45  Knee flexion 135 135  Knee extension -5 -5  Ankle dorsiflexion 20 20  Ankle plantarflexion 50 50  Ankle inversion      Ankle eversion       (Blank rows = not tested)            LOWER EXTREMITY MMT:     MMT Right eval Left eval Right  01/10/22 Left  01/10/22  Hip flexion _0 4+  Hip extension 3+ 3+ 4+ 4+  Hip abduction 4 4 4+ 4+   Hip adduction _1 Hip internal rotation 5 5    Hip external rotation 5 5    Knee flexion 5 5    Knee extension 5 4  4+  Ankle dorsiflexion 5 5    Ankle plantarflexion        Ankle inversion        Ankle eversion         (Blank rows = not tested)      LUMBAR SPECIAL TESTS:  Straight leg raise test: Negative   FUNCTIONAL TESTS:  None    GAIT: Distance walked: 25 ft  Assistive device utilized: None Level of assistance: Complete Independence Comments: Waddle like gait with wide base of support        TODAY'S TREATMENT   01/10/22 Nu-Step Seat and Arm Level 11 at resistance level 3   FOTO: 63  Hip R/L Ext 4+/4+, Abd 4+/4+ Knee, Hip R/L Add 4/4  Ext 4+   Lumbar Flexion 90%  Lumbar Ext 85%*  Side Lying Hip Abduction with Blue TB 2 x 10 Side Lying Hip Abduction with Black TB 3 x 10 -min VC to increase hip flexion    01/05/22 Nu-Step Seat and Arm Level 11 at resistance level 3  MATRIX Hip Abduction #25 3 x 10 MATRIX Hip Adduction #40 3 x 10  OMEGA Leg Press #75 1 x 10  OMEGA Leg Press #105 2 x 10   Standing Marches with 1 UE support 3 x 10     01/04/22  Nu-Step Seat and arm level 10 at resistance level 3  Lower Trunk Rotation 3 x 10  Supine Hip Abduction 2 x 30 sec with PT overpressure  Seated ER Stretch 3 x 30 sec  Supine posterior pelvic tilts with 5 sec tilts x 5             Supine Abdominal Reaches 3 x 10             Standing Hip Abduction 1 x 10             Seated Clam Shell with Blue TB 1 x 10             Supine Clam Shell with Blue TB 1 x 10             Supine Clam Shell with Black TB 3 X 10      PATIENT EDUCATION:  Education details: form and technique for appropriate exercise. Differentials for low back and hip pathologies and treatment.  Person educated: Patient Education method: Explanation, Demonstration, Verbal cues, and Handouts Education comprehension: verbalized understanding, returned  demonstration, and verbal cues required     HOME EXERCISE PROGRAM: Access Code: [email or web address] URL: [email or web address] Date: 01/10/2022 Prepared by: Bradly Chris  Exercises - Supine Lower Trunk Rotation  - 1 x daily - 7 x weekly - 3 sets - 10 reps - Modified Thomas Stretch  - 1 x daily - 7 x  weekly - 1 sets - 3 reps - 30 hold - Sidelying ITB Stretch off Table  - 1 x daily - 7 x weekly - 1 sets - 3 reps - 30 hold - Supine Hamstring Stretch with Strap  - 1 x daily - 7 x weekly - 1 sets - 3 reps - 30 hold - Supine Bridge with Resistance Band  - 1 x daily - 3 x weekly - 3 sets - 10 reps - Standing March with Counter Support  - 1 x daily - 3 x weekly - 3 sets - 10 reps - Seated Hip External Rotation Stretch  - 1 x daily - 7 x weekly - 1 sets - 3 reps - 30 hold - Curl Up with Reach  - 1 x daily - 3 x weekly - 3 sets - 10 reps - Clamshell with Resistance  - 1 x daily - 3 x weekly - 3 sets - 10 reps   ASSESSMENT:   CLINICAL IMPRESSION: Pt demonstrates improved hip strength, lumbar mobility, and self-perception of function. Hip abduction exercise progressed today from supine to side lying. Despite increased baseline pain, exercises did not increase her pain. 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     OBJECTIVE IMPAIRMENTS Abnormal gait, decreased mobility, difficulty walking, decreased strength, impaired flexibility, obesity, and pain.    ACTIVITY LIMITATIONS carrying, lifting, standing, sleeping, dressing, reach over head, hygiene/grooming, and caring for others   PARTICIPATION LIMITATIONS: meal prep, cleaning, laundry, driving, community activity, occupation, and yard work   PERSONAL Education officer, community, Past/current experiences, Sex, Time since onset of injury/illness/exacerbation, and 1 comorbidity: Obesity  are also affecting patient's functional outcome.    REHAB POTENTIAL: Fair prior episode treatment for impairment    CLINICAL DECISION MAKING: Stable/uncomplicated   EVALUATION COMPLEXITY: Low     GOALS: Goals reviewed with patient? No   SHORT TERM GOALS: Target date: 12/15/2021   Pt will be independent  with HEP in order to improve strength and balance in order to decrease fall risk and improve function at home and work. Baseline: Able to perform independently  Goal status: ONGOING        LONG TERM GOALS: Target date: 01/26/2022   Patient will have improved function and activity level as evidenced by an increase in FOTO score by 10 points or more. Baseline: 61/62  01/10/22: 63 Goal status: ACHIEVED    2.  Patient will improve hip and knee strength by 1/2 MMT grade to help offload structures in lower spine for improved activity tolerance and decreased pain response. Baseline: Hip Ext R/L 3+/3+, Hip Add R/L 4/4, Hip Abd 4/4, Hip Flex R 4, Knee Flex R 4  01/10/22: Hip R/L Ext 4+/4+, Abd 4+/4+ Knee, Hip R/L Add 4/4  Ext 4+ Goal status: ACHIEVED    3.  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  Baseline: Lumbar Extension 75% 01/10/22: Lumbar  Ext 85% Goal status: PARTIALLY MET      PLAN: PT FREQUENCY: 1-2x/week   PT DURATION: 6 weeks   PLANNED INTERVENTIONS: Therapeutic exercises, Therapeutic activity, Neuromuscular re-education, Balance training, Gait training, Patient/Family education, Joint manipulation, Joint mobilization, Stair training, DME instructions, Aquatic Therapy, Dry Needling, Electrical stimulation, Spinal manipulation, Spinal mobilization, Cryotherapy, Moist heat, Traction, Manual therapy, and Re-evaluation.   PLAN FOR NEXT SESSION:  Aerobic exercise with introduction of RPE, gait analysis, progress hip and abdominal exercises (step ups)    Bradly Chris PT, DPT  01/10/2022, 10:30 AM

## 2022-01-12 ENCOUNTER — Ambulatory Visit: Payer: Worker's Compensation | Admitting: Occupational Therapy

## 2022-01-12 ENCOUNTER — Ambulatory Visit: Payer: Worker's Compensation | Admitting: Physical Therapy

## 2022-01-17 ENCOUNTER — Encounter: Payer: Self-pay | Admitting: Physical Therapy

## 2022-01-17 ENCOUNTER — Ambulatory Visit
Payer: Commercial Managed Care - HMO | Attending: Student in an Organized Health Care Education/Training Program | Admitting: Occupational Therapy

## 2022-01-17 ENCOUNTER — Ambulatory Visit: Payer: Worker's Compensation | Admitting: Physical Therapy

## 2022-01-17 DIAGNOSIS — M6281 Muscle weakness (generalized): Secondary | ICD-10-CM | POA: Diagnosis present

## 2022-01-17 DIAGNOSIS — G5602 Carpal tunnel syndrome, left upper limb: Secondary | ICD-10-CM | POA: Diagnosis present

## 2022-01-17 DIAGNOSIS — M79645 Pain in left finger(s): Secondary | ICD-10-CM | POA: Diagnosis present

## 2022-01-17 DIAGNOSIS — R262 Difficulty in walking, not elsewhere classified: Secondary | ICD-10-CM

## 2022-01-17 DIAGNOSIS — M5459 Other low back pain: Secondary | ICD-10-CM | POA: Diagnosis not present

## 2022-01-17 DIAGNOSIS — R202 Paresthesia of skin: Secondary | ICD-10-CM | POA: Insufficient documentation

## 2022-01-17 DIAGNOSIS — R200 Anesthesia of skin: Secondary | ICD-10-CM | POA: Diagnosis present

## 2022-01-17 NOTE — Therapy (Signed)
[place] [address], [place], [address] Phone: [phone]   Fax:  [phone]  Occupational Therapy Treatment  Patient Details  Name: Donna Santiago MRN: [id number] Date of Birth: 09/03/1985 Referring Provider (OT): Dr. Allena Katz   Encounter Date: 01/17/2022   OT End of Session - 01/17/22 1254     Visit Number 3    Number of Visits 8    Date for OT Re-Evaluation 02/24/22    OT Start Time 1203    OT Stop Time 1245    OT Time Calculation (min) 42 min    Activity Tolerance Patient tolerated treatment well    Behavior During Therapy [facility] for tasks assessed/performed             Past Medical History:  Diagnosis Date   Diabetes mellitus without complication ([facility])    Hypertension    Hypothyroidism    Migraines    Morbid obesity with body mass index (BMI) of 50.0 to 59.9 in adult [facility])     Past Surgical History:  Procedure Laterality Date   DILATION AND CURETTAGE OF UTERUS     ESOPHAGOGASTRODUODENOSCOPY (EGD) WITH PROPOFOL N/A 08/05/2021   Procedure: ESOPHAGOGASTRODUODENOSCOPY (EGD) WITH PROPOFOL;  Surgeon: [name], DO;  Location: [facility] ENDOSCOPY;  Service: Gastroenterology;  Laterality: N/A;  DM    There were no vitals filed for this visit.   Subjective Assessment - 01/17/22 1251     Subjective  Carpal tunnel is okay but my finger still bothers me is more than a year now.  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 She has pain and tenderness at the PIP joint of the left index finger. She has some decreased range of motion. Referto  occupational therapy to work on range of motion. We will provide materials for  the patient to buddy tape her left index to her left long finger until follow-up in 4 weeks. A work note was provided with restrictions of no lifting, pushing, pulling greater than 1 -was seen by this OT May through July 2022.  Patient returns now with a new order for carpal tunnel symptoms on the left.    Patient Stated Goals I just wanted to help me with my carpal tunnel symptoms to see if I can get that better the numbness.    Currently in Pain? Yes    Pain Score 2     Pain Location Finger (Comment which one)    Pain Orientation Left    Pain Descriptors / Indicators Aching;Tender;Tightness    Pain Type Chronic pain    Pain Onset More than a month ago    Pain Frequency Intermittent                [facility] OT Assessment - 01/17/22 0001       Strength   Left Hand Lateral Pinch 5 lbs   pain 2nd PIP   Left Hand 3 Point Pinch 3 lbs   pain 2nd PIP                     OT Treatments/Exercises (OP) - 01/17/22 0001  Ultrasound   Ultrasound Location PIP 2nd digit , prox phalanges    Ultrasound Parameters 3.67mhz, 20%, 0.8    Ultrasound Goals Pain      LUE Contrast Bath   Time 8 minutes    Comments Prior to range of motion and soft tissue               Patient to do contrast at least twice a day over hand and wrist. Followed by soft tissue massage for metacarpal spreads/carpal spreads as well as webspace.  Done by OT this date.  Patient continues to be tender in webspace of thumb.  Negative Tinel over carpal tunnel as well as tenderness.   Patient report numbness mostly in the morning now.   Wearing splint during the day to with gripping and picking and carrying objects with less carpal tunnel symptoms.    At home family to help her with soft tissue massage to webspace, volar left forearm and wrist.  OT this date did Grasston tool #2 for sweeping over volar wrist and forearm prior to gentle passive range of motion for wrist extension and extension.Marland Kitchen Extension  within normal limits but wrist flexion limited at endrange.   Patient to do light wrist extension stretch 10 reps. Followed by light tendon glides 10 reps.  For pain and discomfort at second digit. Followed by median nerve glides 5 reps. Most of patient's symptoms is still continued to be in the second digit with composite or PIP/DIP flexion.  With pain over PIP.  Decreased prehension strength because of pain in second digit PIP.   Reviewed with patient to focus on when carrying or holding things in large handles and grips.  Pay attention that she does not grip too tight collapsing into wrist flexion. Reviewed with patient before about modifying grip and joint protection.       OT Education - 01/17/22 1254     Education Details Progress and changes in home program as well as splint fitting    Person(s) Educated Patient    Methods Explanation;Demonstration;Tactile cues;Verbal cues;Handout    Comprehension Verbal cues required;Returned demonstration;Verbalized understanding                 OT Long Term Goals - 12/30/21 1052       OT LONG TERM GOAL #1   Title Patient to be independent in home program to decrease carpal tunnel symptoms to less than 2 times a week.    Baseline Patient with numbness in the morning and randomly during the day.  Happening daily.    Time 3    Period Weeks    Status New    Target Date 01/20/22      OT LONG TERM GOAL #2   Title Patient's carpal tunnel symptoms in the left improved for patient's grip and prehension strength to improve to same numbers than last year.    Baseline Grip her left hand 40 pounds decreased by 17; prehension strength and decrease by 3 to 4 pounds.-Now 6 for three-point, 10 pounds for lateral.    Time 8    Period Weeks    Status New    Target Date 02/24/22      OT LONG TERM GOAL #3   Title Patient's left wrist flexion extension improved within normal limits now with no increase symptoms to return to prior level of  function and no episodes of numbness.    Baseline Left wrist extension 70, flexion 82 with symptoms and discomfort over volar wrist.-Had  positive Phalen's    Time 8    Period Weeks    Status New    Target Date 02/24/22                   Plan - 01/17/22 1255     Clinical Impression Statement Patient was seen by this OT last summer after a full at work resulting in a left second digit avulsion fracture. Patient return last week with the new referral for OT for left carpal tunnel symptoms.  Patient report numbness started about 2 months ago during the night and the day stayed numb.  After that is happening on and off in the a.m. as well as with using it.  Patient report nerve conduction test showed moderate carpal tunnel symptoms in bilateral hands but left more symptomatic.  Patient's active range of motion for wrist extension within normal limits but flexion still limited at end range..  Patient is negative for Tinel.  Patient continues to mostly be limited by second digit pain at PIP and tenderness as well as decrease three-point and lateral pinch because of pain.  Patient continues to be having some discomfort with composite flexion of second digit.  Patient report carpal tunnel symptoms better mostly feeling at in the morning-patient wearing prefab wrist splint provided and fitted by OT. 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  Patient can benefit from skilled OT services to improve functional use of left dominant hand.    OT Occupational Profile and History Problem Focused Assessment - Including review of records relating to presenting problem    Occupational performance deficits (Please refer to evaluation for details): ADL's;IADL's;Play;Leisure;Social Participation    Body Structure / Function / Physical Skills ADL;Strength;Decreased knowledge of use of DME;Decreased  knowledge of precautions;Sensation;Flexibility;ROM;IADL;UE functional use;Pain    Rehab Potential Fair    Clinical Decision Making Limited treatment options, no task modification necessary    Comorbidities Affecting Occupational Performance: None    Modification or Assistance to Complete Evaluation  No modification of tasks or assist necessary to complete eval    OT Frequency 1x / week    OT Duration 4 weeks    OT Treatment/Interventions Self-care/ADL training;DME and/or AE instruction;Splinting;Contrast Bath;Passive range of motion;Manual Therapy;Patient/family education    Consulted and Agree with Plan of Care Patient             Patient will benefit from skilled therapeutic intervention in order to improve the following deficits and impairments:   Body Structure / Function / Physical Skills: ADL, Strength, Decreased knowledge of use of DME, Decreased knowledge of precautions, Sensation, Flexibility, ROM, IADL, UE functional use, Pain       Visit Diagnosis: Muscle weakness (generalized)  Carpal tunnel syndrome, left upper limb  Numbness and tingling in left hand  Pain in left finger(s)    Problem List There are no problems to display for this patient.   Rosalyn Gess, OTR/L,CLT 01/17/2022, 1:00 PM  [place] [facility] [address], [place], [address] Phone: [phone]   Fax:  [phone]  Name: Korianna Kamer MRN: [id number] Date of Birth: [birth year]

## 2022-01-17 NOTE — Therapy (Signed)
OUTPATIENT PHYSICAL THERAPY TREATMENT NOTE   Patient Name: Donna Santiago MRN: [id number] DOB:24-Apr-1986, 36 y.o., female Today's Date: 01/17/2022  PCP: Ellene Route  REFERRING PROVIDER: Ignacia Bayley PA-C   END OF SESSION:   PT End of Session - 01/17/22 1110     Visit Number 7    Number of Visits 12    Date for PT Re-Evaluation 01/12/22    Authorization Type Workers Comp    Authorization - Visit Number 7    Authorization - Number of Visits 12    Progress Note Due on Visit 6    PT Start Time 1105    PT Stop Time 1145    PT Time Calculation (min) 40 min    Equipment Utilized During Treatment Gait belt    Activity Tolerance Patient tolerated treatment well    Behavior During Therapy WFL for tasks assessed/performed             Past Medical History:  Diagnosis Date   Diabetes mellitus without complication ([place])    Hypertension    Hypothyroidism    Migraines    Morbid obesity with body mass index (BMI) of 50.0 to 59.9 in adult [facility])    Past Surgical History:  Procedure Laterality Date   DILATION AND CURETTAGE OF UTERUS     ESOPHAGOGASTRODUODENOSCOPY (EGD) WITH PROPOFOL N/A 08/05/2021   Procedure: ESOPHAGOGASTRODUODENOSCOPY (EGD) WITH PROPOFOL;  Surgeon: Annamaria Helling, DO;  Location: [facility] ENDOSCOPY;  Service: Gastroenterology;  Laterality: N/A;  DM   There are no problems to display for this patient.   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 No record of incident from provider in chart because EMR not shared.   PRECAUTIONS: None   SUBJECTIVE: Pt reports that her back pain is somewhat improved since last session. She has continued to be busy so it has been hard for her to be consistent with her exercises.    PAIN:  Are you  having pain? Yes: NPRS scale: 5/10 Pain location: Left sided low back pain  Pain description: Achy Aggravating factors: Walking and moving  Relieving factors: Not moving     OBJECTIVE:              VITALS: BP 130/87 HR 86 SpO2 100     DIAGNOSTIC FINDINGS:  MRI taken at [facility], pt reports no pinched nerves.    PATIENT SURVEYS:  FOTO 61/62 01/10/22: 63    SCREENING FOR RED FLAGS: Bowel or bladder incontinence: No Spinal tumors: No Cauda equina syndrome: No Compression fracture: No Abdominal aneurysm: No   COGNITION:           Overall cognitive status: Within functional limits for tasks assessed                          SENSATION: WFL Intermittent with pain spreading down left leg at night    MUSCLE LENGTH: Hamstrings: Right 60 deg; Left 60 deg Thomas test: Right NT deg; Left NT deg   POSTURE: rounded shoulders   PALPATION: Spinous Process of L1-L5    LUMBAR ROM:    Active  A/PROM  eval AROM  eval   Flexion 80% 90%  Extension 75%* 85%*  Right lateral flexion 100%   Left lateral flexion 100%   Right rotation 100%  Left rotation 100%    (Blank rows = not tested) *=Painful    LOWER EXTREMITY ROM:          Active  Right 10/24/2021 Left 10/24/2021  Hip flexion 120 120  Hip extension 20 20  Hip abduction 45 45  Hip adduction 30 30  Hip internal rotation 45 45  Hip external rotation 45 45  Knee flexion 135 135  Knee extension -5 -5  Ankle dorsiflexion 20 20  Ankle plantarflexion 50 50  Ankle inversion      Ankle eversion       (Blank rows = not tested)            LOWER EXTREMITY MMT:     MMT Right eval Left eval Right  01/10/22 Left  01/10/22  Hip flexion _0 4+  Hip extension 3+ 3+ 4+ 4+  Hip abduction 4 4 4+ 4+   Hip adduction _1 Hip internal rotation 5 5    Hip external rotation 5 5    Knee flexion 5 5    Knee extension 5 4  4+  Ankle dorsiflexion 5 5    Ankle plantarflexion        Ankle inversion        Ankle eversion          (Blank rows = not tested)     LUMBAR SPECIAL TESTS:  Straight leg raise test: Negative   FUNCTIONAL TESTS:  None    GAIT: Distance walked: 25 ft  Assistive device utilized: None Level of assistance: Complete Independence Comments: Waddle like gait with wide base of support        TODAY'S TREATMENT   01/17/22 Nu-Step Seat and Arm Level 11 at resistance level 3 for 6 min  Runner Step Up on 4 inch platform with 1 UE support 2 x 10  Runner Step Up on 6 inch platform with 1 UE support 2 x 10  Runner Step Up on 8 inch platform with 1 UE support 2 x 10  Seated Hip ER on LLE 3 x 30 sec  Side Step Up 3 x 10 on 8 inch step   Omega Palloff Press with #10 2 x 10    01/10/22 Nu-Step Seat and Arm Level 11 at resistance level 3   FOTO: 63  Hip R/L Ext 4+/4+, Abd 4+/4+ Knee, Hip R/L Add 4/4  Ext 4+   Lumbar Flexion 90%  Lumbar Ext 85%*  Side Lying Hip Abduction with Blue TB 2 x 10 Side Lying Hip Abduction with Black TB 3 x 10 -min VC to increase hip flexion    01/05/22 Nu-Step Seat and Arm Level 11 at resistance level 3  MATRIX Hip Abduction #25 3 x 10 MATRIX Hip Adduction #40 3 x 10  OMEGA Leg Press #75 1 x 10  OMEGA Leg Press #105 2 x 10   Standing Marches with 1 UE support 3 x 10     01/04/22  Nu-Step Seat and arm level 10 at resistance level 3  Lower Trunk Rotation 3 x 10  Supine Hip Abduction 2 x 30 sec with PT overpressure  Seated ER Stretch 3 x 30 sec  Supine posterior pelvic tilts with 5 sec tilts x 5             Supine Abdominal Reaches 3 x 10             Standing Hip Abduction 1 x 10  Seated Clam Shell with Blue TB 1 x 10             Supine Clam Shell with Blue TB 1 x 10             Supine Clam Shell with Black TB 3 X 10      PATIENT EDUCATION:  Education details: form and technique for appropriate exercise. Differentials for low back and hip pathologies and treatment.  Person educated: Patient Education method: Explanation,  Demonstration, Verbal cues, and Handouts Education comprehension: verbalized understanding, returned demonstration, and verbal cues required     HOME EXERCISE PROGRAM: Access Code: [email or web address] URL: [email or web address] Date: 01/10/2022 Prepared by: Bradly Chris  Exercises - Supine Lower Trunk Rotation  - 1 x daily - 7 x weekly - 3 sets - 10 reps - Modified Thomas Stretch  - 1 x daily - 7 x weekly - 1 sets - 3 reps - 30 hold - Sidelying ITB Stretch off Table  - 1 x daily - 7 x weekly - 1 sets - 3 reps - 30 hold - Supine Hamstring Stretch with Strap  - 1 x daily - 7 x weekly - 1 sets - 3 reps - 30 hold - Supine Bridge with Resistance Band  - 1 x daily - 3 x weekly - 3 sets - 10 reps - Standing March with Counter Support  - 1 x daily - 3 x weekly - 3 sets - 10 reps - Seated Hip External Rotation Stretch  - 1 x daily - 7 x weekly - 1 sets - 3 reps - 30 hold - Curl Up with Reach  - 1 x daily - 3 x weekly - 3 sets - 10 reps - Clamshell with Resistance  - 1 x daily - 3 x weekly - 3 sets - 10 reps   ASSESSMENT:   CLINICAL IMPRESSION: Pt exhibits an improvement in hip strength with ability to perform step ups and side step ups with minimal UE support and no increase in NPS. 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  OBJECTIVE IMPAIRMENTS Abnormal gait, decreased mobility, difficulty walking, decreased strength, impaired flexibility, obesity, and pain.    ACTIVITY LIMITATIONS carrying, lifting, standing, sleeping, dressing, reach over head, hygiene/grooming, and caring for others   PARTICIPATION LIMITATIONS: meal prep, cleaning, laundry, driving, community activity, occupation, and yard work   PERSONAL Education officer, community, Past/current experiences, Sex, Time since onset of injury/illness/exacerbation, and 1 comorbidity:  Obesity  are also affecting patient's functional outcome.    REHAB POTENTIAL: Fair prior episode treatment for impairment    CLINICAL DECISION MAKING: Stable/uncomplicated   EVALUATION COMPLEXITY: Low     GOALS: Goals reviewed with patient? No   SHORT TERM GOALS: Target date: 12/15/2021   Pt will be independent with HEP in order to improve strength and balance in order to decrease fall risk and improve function at home and work. Baseline: Able to perform independently  Goal status: ONGOING        LONG TERM GOALS: Target date: 01/26/2022   Patient will have improved function and activity level as evidenced by an increase in FOTO score by 10 points or more. Baseline: 61/62  01/10/22: 63 Goal status: ACHIEVED    2.  Patient will improve hip and knee strength by 1/2 MMT grade to help offload structures in  lower spine for improved activity tolerance and decreased pain response. Baseline: Hip Ext R/L 3+/3+, Hip Add R/L 4/4, Hip Abd 4/4, Hip Flex R 4, Knee Flex R 4  01/10/22: Hip R/L Ext 4+/4+, Abd 4+/4+ Knee, Hip R/L Add 4/4  Ext 4+ Goal status: ACHIEVED    3.  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  Baseline: Lumbar Extension 75% 01/10/22: Lumbar Ext 85% Goal status: PARTIALLY MET      PLAN: PT FREQUENCY: 1-2x/week   PT DURATION: 6 weeks   PLANNED INTERVENTIONS: Therapeutic exercises, Therapeutic activity, Neuromuscular re-education, Balance training, Gait training, Patient/Family education, Joint manipulation, Joint mobilization, Stair training, DME instructions, Aquatic Therapy, Dry Needling, Electrical stimulation, Spinal manipulation, Spinal mobilization, Cryotherapy, Moist heat, Traction, Manual therapy, and Re-evaluation.   PLAN FOR NEXT SESSION:  Aerobic exercise with introduction of RPE, gait analysis, progress abdominal exercises and hip exercises     Bradly Chris PT, DPT  01/17/2022, 11:11 AM

## 2022-01-19 ENCOUNTER — Ambulatory Visit: Payer: Worker's Compensation | Admitting: Physical Therapy

## 2022-01-24 ENCOUNTER — Ambulatory Visit: Payer: Worker's Compensation | Admitting: Physical Therapy

## 2022-01-24 ENCOUNTER — Encounter: Payer: Self-pay | Admitting: Physical Therapy

## 2022-01-24 DIAGNOSIS — M5459 Other low back pain: Secondary | ICD-10-CM

## 2022-01-24 DIAGNOSIS — R262 Difficulty in walking, not elsewhere classified: Secondary | ICD-10-CM

## 2022-01-24 NOTE — Therapy (Addendum)
OUTPATIENT PHYSICAL THERAPY TREATMENT NOTE/ Re-certification   Episode of Care: 12/01/21-01/26/22  Patient Name: Donna Santiago MRN: [id number] DOB:[birth year], 36 y.o., female Today's Date: 01/24/2022  PCP: Ellene Route  REFERRING PROVIDER: Ignacia Bayley PA-C   END OF SESSION:   PT End of Session - 01/24/22 1025     Visit Number 8    Number of Visits 12    Date for PT Re-Evaluation 01/12/22    Authorization Type Workers Comp    Authorization - Visit Number 8    Authorization - Number of Visits 12    Progress Note Due on Visit 6    PT Start Time 1020    PT Stop Time 1100    PT Time Calculation (min) 40 min    Equipment Utilized During Treatment Gait belt    Activity Tolerance Patient tolerated treatment well    Behavior During Therapy WFL for tasks assessed/performed             Past Medical History:  Diagnosis Date   Diabetes mellitus without complication ([place])    Hypertension    Hypothyroidism    Migraines    Morbid obesity with body mass index (BMI) of 50.0 to 59.9 in adult [facility])    Past Surgical History:  Procedure Laterality Date   DILATION AND CURETTAGE OF UTERUS     ESOPHAGOGASTRODUODENOSCOPY (EGD) WITH PROPOFOL N/A 08/05/2021   Procedure: ESOPHAGOGASTRODUODENOSCOPY (EGD) WITH PROPOFOL;  Surgeon: Annamaria Helling, DO;  Location: [facility] ENDOSCOPY;  Service: Gastroenterology;  Laterality: N/A;  DM   There are no problems to display for this patient.   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 No record of incident from provider in chart because EMR not shared.   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  PAIN:   Are you having pain? Yes: NPRS scale: 6/10 Pain location: Left sided low back pain  Pain description: Achy Aggravating factors: Walking and moving  Relieving factors: Not moving     OBJECTIVE:              VITALS: BP 130/87 HR 86 SpO2 100     DIAGNOSTIC FINDINGS:  MRI taken at [facility], pt reports no pinched nerves.    PATIENT SURVEYS:  FOTO 61/62 01/10/22: 63    SCREENING FOR RED FLAGS: Bowel or bladder incontinence: No Spinal tumors: No Cauda equina syndrome: No Compression fracture: No Abdominal aneurysm: No   COGNITION:           Overall cognitive status: Within functional limits for tasks assessed                          SENSATION: WFL Intermittent with pain spreading down left leg at night    MUSCLE LENGTH: Hamstrings: Right 60 deg; Left 60 deg Thomas test: Right NT deg; Left NT deg   POSTURE: rounded shoulders   PALPATION: Spinous Process of L1-L5    LUMBAR ROM:    Active  A/PROM  eval AROM  eval   Flexion 80% 90%  Extension 75%* 85%*  Right lateral flexion 100%   Left lateral flexion 100%   Right rotation 100%   Left rotation 100%    (  Blank rows = not tested) *=Painful    LOWER EXTREMITY ROM:          Active  Right 10/24/2021 Left 10/24/2021  Hip flexion 120 120  Hip extension 20 20  Hip abduction 45 45  Hip adduction 30 30  Hip internal rotation 45 45  Hip external rotation 45 45  Knee flexion 135 135  Knee extension -5 -5  Ankle dorsiflexion 20 20  Ankle plantarflexion 50 50  Ankle inversion      Ankle eversion       (Blank rows = not tested)            LOWER EXTREMITY MMT:     MMT Right eval Left eval Right  01/10/22 Left  01/10/22  Hip flexion 5 4 5  4+  Hip extension 3+ 3+ 4+ 4+  Hip abduction 4 4 4+ 4+   Hip adduction 4 4 4 4   Hip internal rotation 5 5    Hip external rotation 5 5    Knee flexion 5 5    Knee extension 5 4  4+  Ankle dorsiflexion 5 5    Ankle plantarflexion        Ankle inversion         Ankle eversion         (Blank rows = not tested)     LUMBAR SPECIAL TESTS:  Straight leg raise test: Negative   FUNCTIONAL TESTS:  None    GAIT: Distance walked: 25 ft  Assistive device utilized: None Level of assistance: Complete Independence Comments: Waddle like gait with wide base of support        TODAY'S TREATMENT   01/24/22 Nu-Step Seat and Arm Level 11 at resistance level 1 for 6 min  Supine Bridges with Hip abduction using Green TB 3 x 10 Side Lying Hip abduction on left side with Green TB 2 x 10 -Pt reports increased left sided low back pain   Seated Hip Abduction with Black 3 x 10   Supine Abdominal Reaches 3 x 10   01/17/22 Nu-Step Seat and Arm Level 11 at resistance level 3 for 6 min  Runner Step Up on 4 inch platform with 1 UE support 2 x 10  Runner Step Up on 6 inch platform with 1 UE support 2 x 10  Runner Step Up on 8 inch platform with 1 UE support 2 x 10  Seated Hip ER on LLE 3 x 30 sec  Side Step Up 3 x 10 on 8 inch step   Omega Palloff Press with #10 2 x 10    01/10/22 Nu-Step Seat and Arm Level 11 at resistance level 3   FOTO: 63  Hip R/L Ext 4+/4+, Abd 4+/4+ Knee, Hip R/L Add 4/4  Ext 4+   Lumbar Flexion 90%  Lumbar Ext 85%*  Side Lying Hip Abduction with Blue TB 2 x 10 Side Lying Hip Abduction with Black TB 3 x 10 -min VC to increase hip flexion    01/05/22 Nu-Step Seat and Arm Level 11 at resistance level 3  MATRIX Hip Abduction #25 3 x 10 MATRIX Hip Adduction #40 3 x 10  OMEGA Leg Press #75 1 x 10  OMEGA Leg Press #105 2 x 10   Standing Marches with 1 UE support 3 x 10     01/04/22  Nu-Step Seat and arm level 10 at resistance level 3  Lower Trunk Rotation 3 x 10  Supine Hip Abduction 2 x 30 sec  with PT overpressure  Seated ER Stretch 3 x 30 sec  Supine posterior pelvic tilts with 5 sec tilts x 5             Supine Abdominal Reaches 3 x 10             Standing Hip Abduction 1 x 10             Seated Clam Shell with Blue TB 1  x 10             Supine Clam Shell with Blue TB 1 x 10             Supine Clam Shell with Black TB 3 X 10      PATIENT EDUCATION:  Education details: form and technique for appropriate exercise. Differentials for low back and hip pathologies and treatment.  Person educated: Patient Education method: Explanation, Demonstration, Verbal cues, and Handouts Education comprehension: verbalized understanding, returned demonstration, and verbal cues required     HOME EXERCISE PROGRAM: Access Code: [email or web address] URL: [email or web address] Date: 01/24/2022 Prepared by: Bradly Chris  Exercises - Supine Lower Trunk Rotation  - 1 x daily - 7 x weekly - 3 sets - 10 reps - Modified Thomas Stretch  - 1 x daily - 7 x weekly - 1 sets - 3 reps - 30 hold - Supine Bridge with Resistance Band  - 1 x daily - 3 x weekly - 3 sets - 10 reps - Seated Hip External Rotation Stretch  - 1 x daily - 7 x weekly - 1 sets - 3 reps - 30 hold - Curl Up with Reach  - 1 x daily - 3 x weekly - 3 sets - 10 reps - Seated Hip Abduction with Resistance  - 1 x daily - 3 x weekly - 3 sets - 10 reps  ASSESSMENT:   CLINICAL IMPRESSION:  Pt returning from recent hiatus due to increase in low back pain. She was able to complete all exercises without an increase in her pain with the exception of the side lying exercises. HEP modified to only include exercises patient was able to perform independently in clinic without an increase in her pain. 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  OBJECTIVE IMPAIRMENTS Abnormal gait, decreased mobility, difficulty walking, decreased strength, impaired flexibility, obesity, and pain.    ACTIVITY LIMITATIONS carrying, lifting, standing, sleeping, dressing, reach over head, hygiene/grooming, and caring for others    PARTICIPATION LIMITATIONS: meal prep, cleaning, laundry, driving, community activity, occupation, and yard work   PERSONAL Education officer, community, Past/current experiences, Sex, Time since onset of injury/illness/exacerbation, and 1 comorbidity: Obesity  are also affecting patient's functional outcome.    REHAB POTENTIAL: Fair prior episode treatment for impairment    CLINICAL DECISION MAKING: Stable/uncomplicated   EVALUATION COMPLEXITY: Low     GOALS: Goals reviewed with patient? No   SHORT TERM GOALS: Target date: 12/15/2021   Pt will be independent with HEP in order to improve strength and balance in order to decrease fall risk and improve function at home and work. Baseline: Able to perform independently  Goal status: ONGOING        LONG TERM GOALS: Target date: 01/26/2022   Patient will have improved function and activity level as evidenced by an increase in FOTO score by 10 points or  more. Baseline: 61/62  01/10/22: 63 Goal status: ACHIEVED    2.  Patient will improve hip and knee strength by 1/2 MMT grade to help offload structures in lower spine for improved activity tolerance and decreased pain response. Baseline: Hip Ext R/L 3+/3+, Hip Add R/L 4/4, Hip Abd 4/4, Hip Flex R 4, Knee Flex R 4  01/10/22: Hip R/L Ext 4+/4+, Abd 4+/4+ Knee, Hip R/L Add 4/4  Ext 4+ Goal status: ACHIEVED    3.  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  Baseline: Lumbar Extension 75% 01/10/22: Lumbar Ext 85% Goal status: PARTIALLY MET      PLAN: PT FREQUENCY: 1-2x/week   PT DURATION: 6 weeks   PLANNED INTERVENTIONS: Therapeutic exercises, Therapeutic activity, Neuromuscular re-education, Balance training, Gait training, Patient/Family education, Joint manipulation, Joint mobilization, Stair training, DME instructions, Aquatic Therapy, Dry Needling, Electrical stimulation, Spinal manipulation,  Spinal mobilization, Cryotherapy, Moist heat, Traction, Manual therapy, and Re-evaluation.   PLAN FOR NEXT SESSION:  Aerobic exercise with introduction of RPE, gait analysis, progress abdominal exercises and hip exercises     Bradly Chris PT, DPT  01/24/2022, 10:27 AM

## 2022-01-26 ENCOUNTER — Ambulatory Visit: Payer: Worker's Compensation | Admitting: Physical Therapy

## 2022-01-30 ENCOUNTER — Encounter: Payer: Self-pay | Admitting: Physical Therapy

## 2022-01-30 ENCOUNTER — Ambulatory Visit: Payer: Worker's Compensation | Admitting: Physical Therapy

## 2022-01-30 DIAGNOSIS — M5459 Other low back pain: Secondary | ICD-10-CM | POA: Diagnosis not present

## 2022-01-30 DIAGNOSIS — R262 Difficulty in walking, not elsewhere classified: Secondary | ICD-10-CM

## 2022-01-30 NOTE — Therapy (Unsigned)
OUTPATIENT PHYSICAL THERAPY TREATMENT NOTE   Patient Name: Donna Santiago MRN: [id number] DOB:05-26-1986, 36 y.o., female Today's Date: 01/30/2022  PCP: Ellene Route  REFERRING PROVIDER: Ignacia Bayley PA-C   END OF SESSION:   PT End of Session - 01/30/22 1809     Visit Number 9    Number of Visits 12    Date for PT Re-Evaluation 01/12/22    Authorization Type Workers Comp    Authorization - Visit Number 9    Authorization - Number of Visits 12    Progress Note Due on Visit 6    PT Start Time 1803    PT Stop Time 1845    PT Time Calculation (min) 42 min    Equipment Utilized During Treatment Gait belt    Activity Tolerance Patient tolerated treatment well    Behavior During Therapy WFL for tasks assessed/performed             Past Medical History:  Diagnosis Date   Diabetes mellitus without complication ([place])    Hypertension    Hypothyroidism    Migraines    Morbid obesity with body mass index (BMI) of 50.0 to 59.9 in adult [facility])    Past Surgical History:  Procedure Laterality Date   DILATION AND CURETTAGE OF UTERUS     ESOPHAGOGASTRODUODENOSCOPY (EGD) WITH PROPOFOL N/A 08/05/2021   Procedure: ESOPHAGOGASTRODUODENOSCOPY (EGD) WITH PROPOFOL;  Surgeon: Annamaria Helling, DO;  Location: [facility] ENDOSCOPY;  Service: Gastroenterology;  Laterality: N/A;  DM   There are no problems to display for this patient.   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 No record of incident from provider in chart because EMR not shared.   PRECAUTIONS: None   SUBJECTIVE: Pt reports she feels that her function is improving but she continues to feel pain despite these improvements.   PAIN:  Are you having pain? No    OBJECTIVE:              VITALS: BP  130/87 HR 86 SpO2 100     DIAGNOSTIC FINDINGS:  MRI taken at [facility], pt reports no pinched nerves.    PATIENT SURVEYS:  FOTO 61/62 01/10/22: 63    SCREENING FOR RED FLAGS: Bowel or bladder incontinence: No Spinal tumors: No Cauda equina syndrome: No Compression fracture: No Abdominal aneurysm: No   COGNITION:           Overall cognitive status: Within functional limits for tasks assessed                          SENSATION: WFL Intermittent with pain spreading down left leg at night    MUSCLE LENGTH: Hamstrings: Right 60 deg; Left 60 deg Thomas test: Right NT deg; Left NT deg   POSTURE: rounded shoulders   PALPATION: Spinous Process of L1-L5    LUMBAR ROM:    Active  A/PROM  eval AROM  eval   Flexion 80% 90%  Extension 75%* 85%*  Right lateral flexion 100%   Left lateral flexion 100%   Right rotation 100%   Left rotation 100%    (Blank rows = not tested) *=Painful    LOWER EXTREMITY ROM:          Active  Right 10/24/2021 Left 10/24/2021  Hip flexion 120  120  Hip extension 20 20  Hip abduction 45 45  Hip adduction 30 30  Hip internal rotation 45 45  Hip external rotation 45 45  Knee flexion 135 135  Knee extension -5 -5  Ankle dorsiflexion 20 20  Ankle plantarflexion 50 50  Ankle inversion      Ankle eversion       (Blank rows = not tested)            LOWER EXTREMITY MMT:     MMT Right eval Left eval Right  01/10/22 Left  01/10/22  Hip flexion _0 4+  Hip extension 3+ 3+ 4+ 4+  Hip abduction 4 4 4+ 4+   Hip adduction _1 Hip internal rotation 5 5    Hip external rotation 5 5    Knee flexion 5 5    Knee extension 5 4  4+  Ankle dorsiflexion 5 5    Ankle plantarflexion        Ankle inversion        Ankle eversion         (Blank rows = not tested)     LUMBAR SPECIAL TESTS:  Straight leg raise test: Negative   FUNCTIONAL TESTS:  None    GAIT: Distance walked: 25 ft  Assistive device utilized: None Level of  assistance: Complete Independence Comments: Waddle like gait with wide base of support        TODAY'S TREATMENT   01/30/22  Nu-Step Seat and Arm Level 11 at resistance level 1 for 6 min   Lower Trunk Rotation 3 x 10   Mini-Squats from 23 inch mat height  1 x 10 -TC for tapping down on seat    Mini-Squats from 22 inch mat height 1 x 10   Standing hip abduction with UE support 3 x 10   01/24/22 Nu-Step Seat and Arm Level 11 at resistance level 1 for 6 min  Supine Bridges with Hip abduction using Green TB 3 x 10 Side Lying Hip abduction on left side with Green TB 2 x 10 -Pt reports increased left sided low back pain   Seated Hip Abduction with Black 3 x 10   Supine Abdominal Reaches 3 x 10   01/17/22 Nu-Step Seat and Arm Level 11 at resistance level 3 for 6 min  Runner Step Up on 4 inch platform with 1 UE support 2 x 10  Runner Step Up on 6 inch platform with 1 UE support 2 x 10  Runner Step Up on 8 inch platform with 1 UE support 2 x 10  Seated Hip ER on LLE 3 x 30 sec  Side Step Up 3 x 10 on 8 inch step   Omega Palloff Press with #10 2 x 10    01/10/22 Nu-Step Seat and Arm Level 11 at resistance level 3   FOTO: 63  Hip R/L Ext 4+/4+, Abd 4+/4+ Knee, Hip R/L Add 4/4  Ext 4+   Lumbar Flexion 90%  Lumbar Ext 85%*  Side Lying Hip Abduction with Blue TB 2 x 10 Side Lying Hip Abduction with Black TB 3 x 10 -min VC to increase hip flexion        PATIENT EDUCATION:  Education details: form and technique for appropriate exercise. Differentials for low back and hip pathologies and treatment.  Person educated: Patient Education method: Explanation, Demonstration, Verbal cues, and Handouts Education comprehension: verbalized understanding, returned demonstration, and verbal cues required  HOME EXERCISE PROGRAM: Access Code: [email or web address] URL: [email or web address] Date: 01/30/2022 Prepared by: Bradly Chris  Exercises - Supine Lower Trunk Rotation  -  1 x daily - 7 x weekly - 3 sets - 10 reps - Modified Thomas Stretch  - 1 x daily - 7 x weekly - 1 sets - 3 reps - 30 hold - Seated Hip External Rotation Stretch  - 1 x daily - 7 x weekly - 1 sets - 3 reps - 30 hold - Curl Up with Reach  - 1 x daily - 3 x weekly - 3 sets - 10 reps - Mini Squat  - 1 x daily - 3 x weekly - 3 sets - 10 reps - Standing Hip Abduction with Counter Support  - 1 x daily - 3 x weekly - 3 sets - 10 reps  ASSESSMENT:   CLINICAL IMPRESSION:  Pt shows an improvement in pain response compared to last session. However, over course of plan of care, the decrease in her pain response has not been consistent. She does show an improvement in LE strength with ability to perform mini-squats and standing hip abduction. Next session will be re-evaluation to determine progress and plan of care. 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  OBJECTIVE IMPAIRMENTS Abnormal gait, decreased mobility, difficulty walking, decreased strength, impaired flexibility, obesity, and pain.    ACTIVITY LIMITATIONS carrying, lifting, standing, sleeping, dressing, reach over head, hygiene/grooming, and caring for others   PARTICIPATION LIMITATIONS: meal prep, cleaning, laundry, driving, community activity, occupation, and yard work   PERSONAL Education officer, community, Past/current experiences, Sex, Time since onset of injury/illness/exacerbation, and 1 comorbidity: Obesity  are also affecting patient's functional outcome.    REHAB POTENTIAL: Fair prior episode treatment for impairment    CLINICAL DECISION MAKING: Stable/uncomplicated   EVALUATION COMPLEXITY: Low     GOALS: Goals reviewed with patient? No   SHORT TERM GOALS: Target date: 12/15/2021   Pt will be independent with HEP in order to improve strength and balance in order to decrease  fall risk and improve function at home and work. Baseline: Able to perform independently  Goal status: ONGOING        LONG TERM GOALS: Target date: 01/26/2022   Patient will have improved function and activity level as evidenced by an increase in FOTO score by 10 points or more. Baseline: 61/62  01/10/22: 63 Goal status: ACHIEVED    2.  Patient will improve hip and knee strength by 1/2 MMT grade to help offload structures in lower spine for improved activity tolerance and decreased pain response. Baseline: Hip Ext R/L 3+/3+, Hip Add R/L 4/4, Hip Abd 4/4, Hip Flex R 4, Knee Flex R 4  01/10/22: Hip R/L Ext 4+/4+, Abd 4+/4+ Knee, Hip R/L Add 4/4  Ext 4+ Goal status: ACHIEVED    3.  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  Baseline: Lumbar Extension 75% 01/10/22: Lumbar Ext 85% Goal status: PARTIALLY MET      PLAN: PT FREQUENCY: 1-2x/week   PT DURATION: 6 weeks   PLANNED INTERVENTIONS: Therapeutic exercises, Therapeutic activity, Neuromuscular re-education, Balance training, Gait training, Patient/Family education, Joint manipulation, Joint mobilization, Stair training, DME instructions, Aquatic Therapy, Dry Needling, Electrical stimulation, Spinal manipulation, Spinal mobilization,  Cryotherapy, Moist heat, Traction, Manual therapy, and Re-evaluation.   PLAN FOR NEXT SESSION:   Re-eval. Aerobic exercise with introduction of RPE, gait analysis, progress abdominal exercises and hip exercises     Bradly Chris PT, DPT  01/30/2022, 6:10 PM

## 2022-01-31 ENCOUNTER — Telehealth: Payer: Self-pay | Admitting: Physical Therapy

## 2022-01-31 NOTE — Telephone Encounter (Signed)
Message created in error

## 2022-02-01 ENCOUNTER — Ambulatory Visit: Payer: Worker's Compensation | Attending: Physician Assistant | Admitting: Physical Therapy

## 2022-02-01 ENCOUNTER — Encounter: Payer: Self-pay | Admitting: Physical Therapy

## 2022-02-01 DIAGNOSIS — R262 Difficulty in walking, not elsewhere classified: Secondary | ICD-10-CM | POA: Diagnosis present

## 2022-02-01 DIAGNOSIS — M5459 Other low back pain: Secondary | ICD-10-CM

## 2022-02-01 IMAGING — CT CT RENAL STONE PROTOCOL
2 of 4 series · 16 of 46 positions shown, 18 images · non-contrast
Comparison: None

CLINICAL DATA: Abdominal and epigastric pain intermittently,
diarrhea yesterday, flank pain, question renal calculus

EXAM:
CT ABDOMEN AND PELVIS WITHOUT CONTRAST
TECHNIQUE: Multidetector CT imaging of the abdomen and pelvis was performed
following the standard protocol without IV contrast. Sagittal and
coronal MPR images reconstructed from axial data set. Oral contrast
not administered for this indication.

[Series 3: stone full standard · axial · 0.93mm/px · z∈[+186,+666]mm · 13 of 106 slices shown, 15 images]
[im 5/106  soft-tissue]
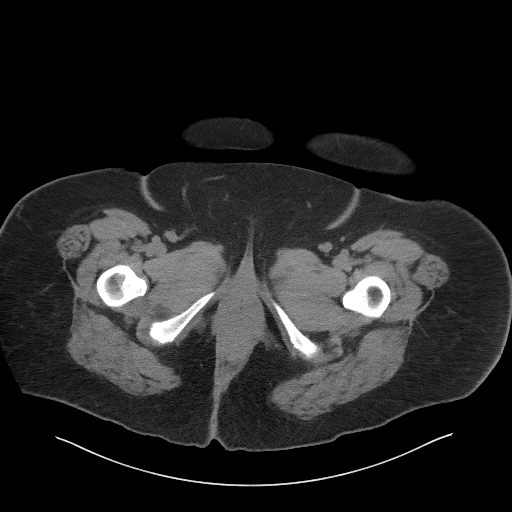
[im 5/106  bone]
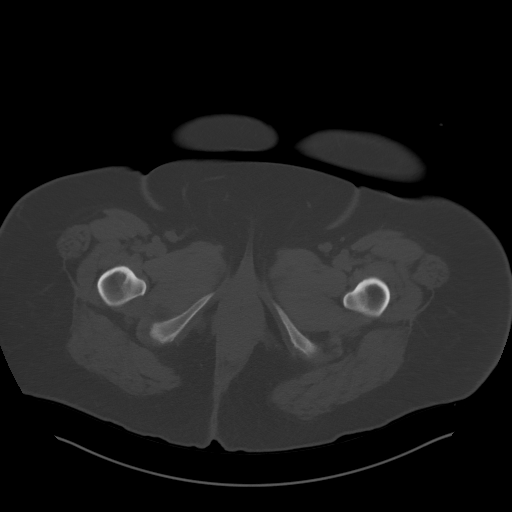
[im 14/106  soft-tissue]
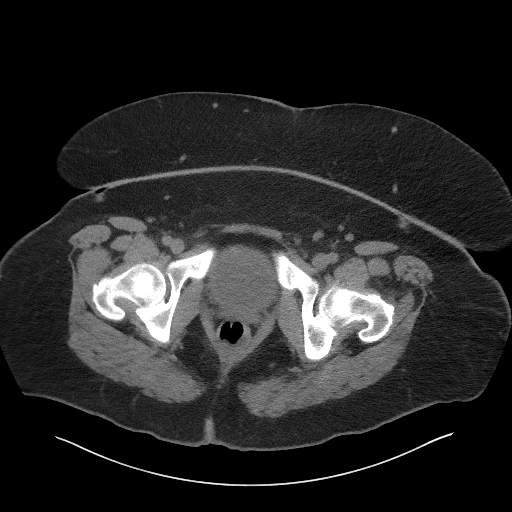
[im 22/106  soft-tissue]
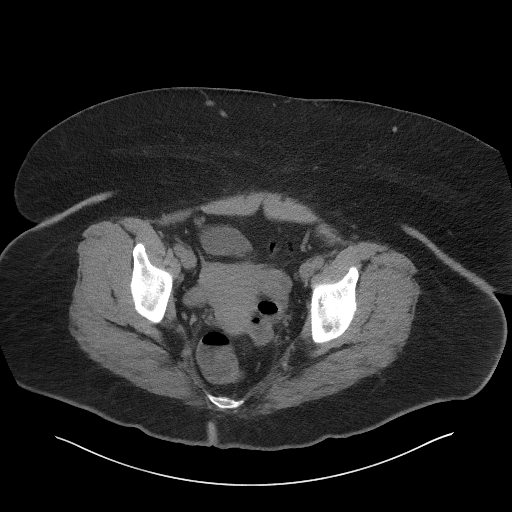
[im 31/106  soft-tissue]
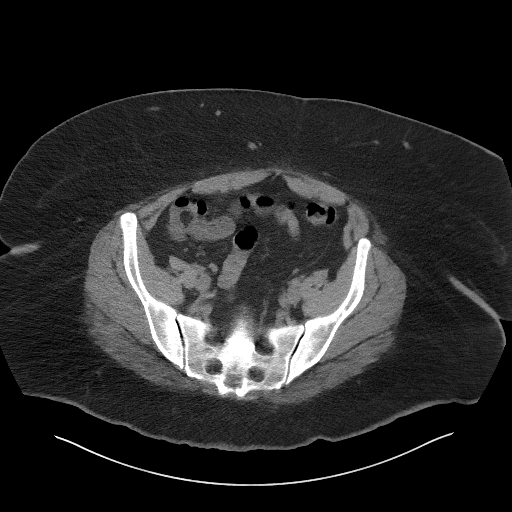
[im 36/106  soft-tissue]
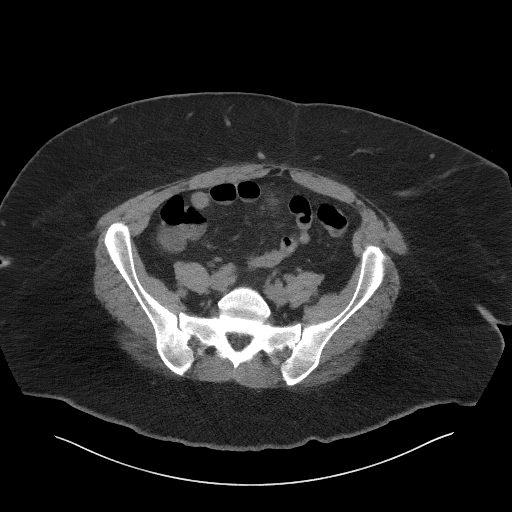
[im 44/106  soft-tissue]
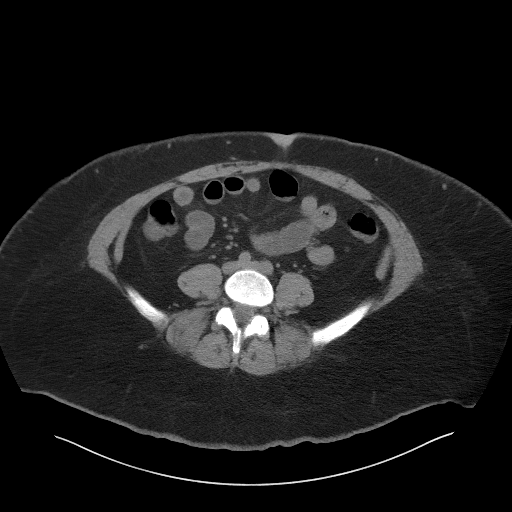
[im 53/106  soft-tissue]
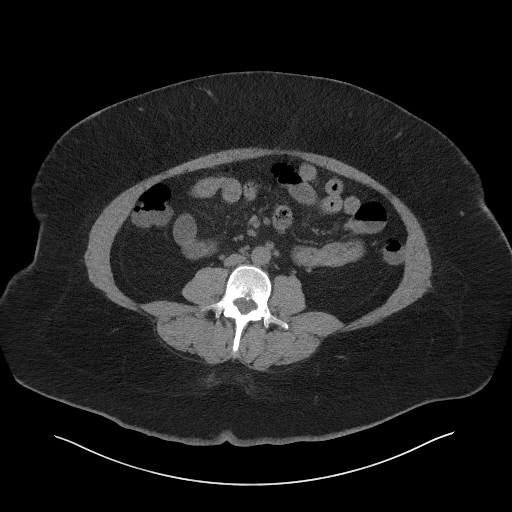
[im 62/106  soft-tissue]
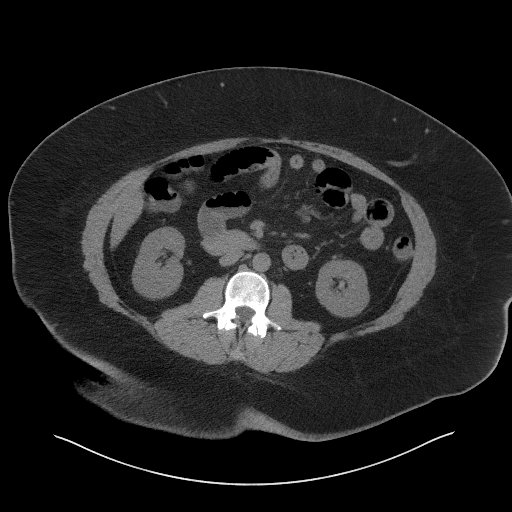
[im 71/106  soft-tissue]
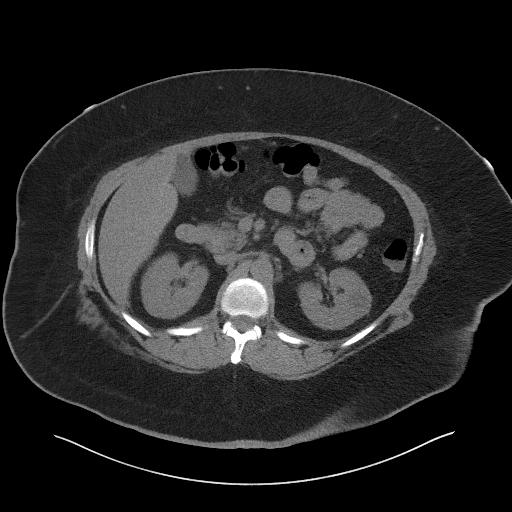
[im 71/106  bone]
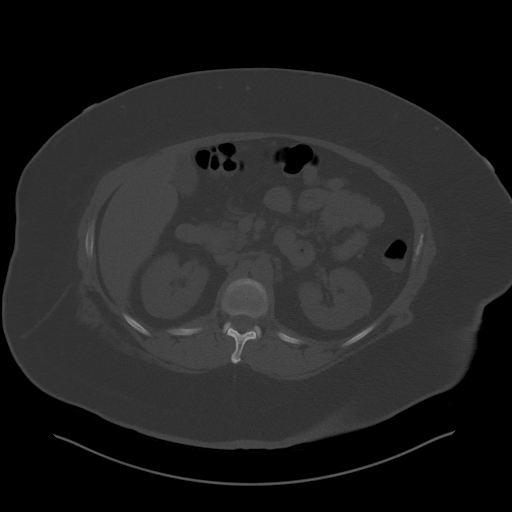
[im 75/106  soft-tissue]
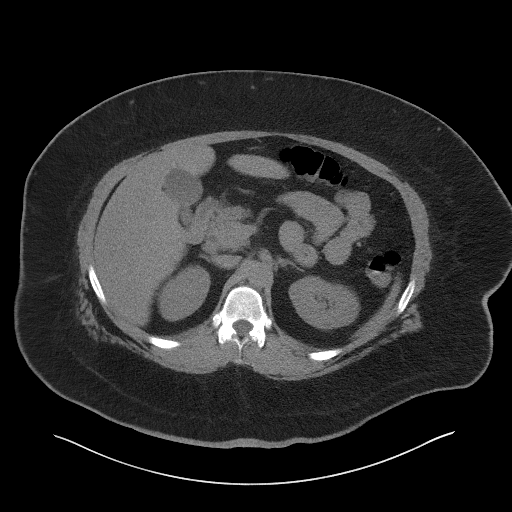
[im 84/106  soft-tissue]
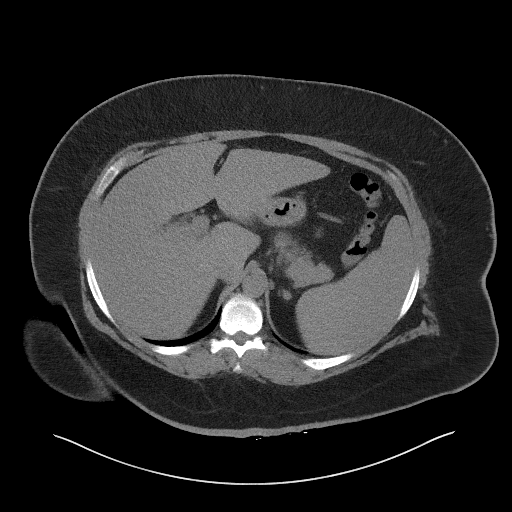
[im 92/106  soft-tissue]
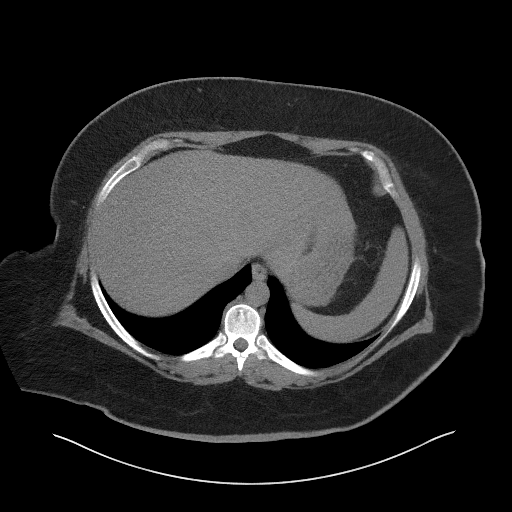
[im 101/106  soft-tissue]
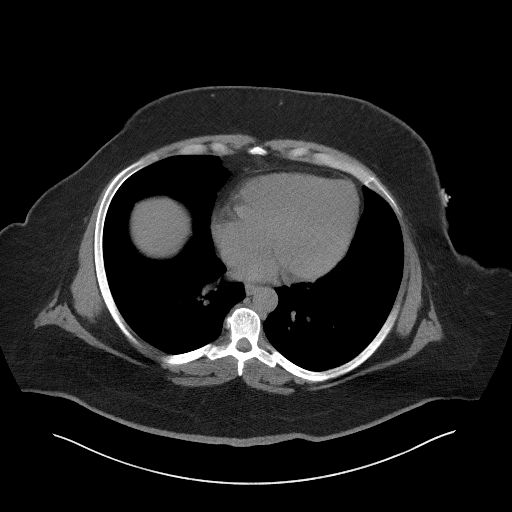

[Series 6: coronal · coronal · 0.83mm/px · 3 of 157 slices shown]
[im 53/157  soft-tissue]
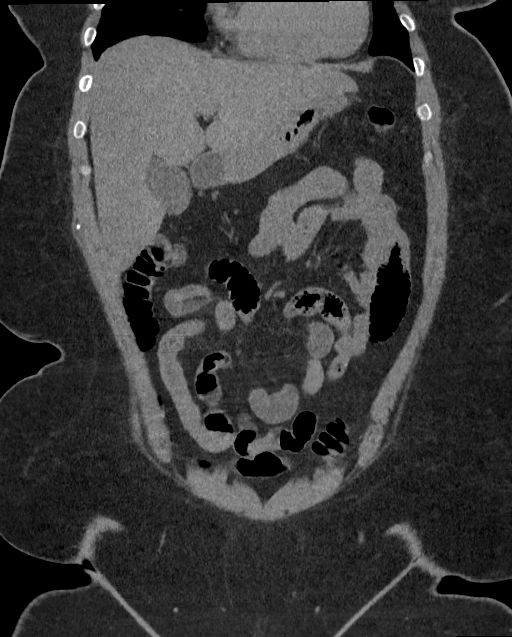
[im 70/157  soft-tissue]
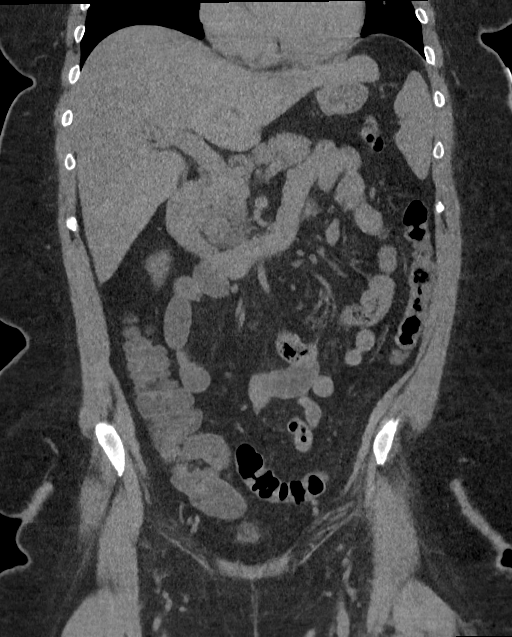
[im 87/157  soft-tissue]
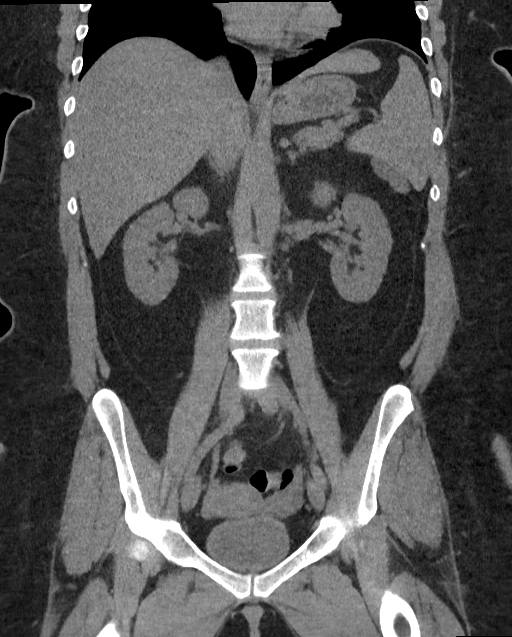

[16 of 46 positions shown; findings below may reference images not displayed]

FINDINGS: Lower chest: Lung bases clear

Hepatobiliary: Gallbladder and liver normal appearance

Pancreas: Normal appearance

Spleen: Normal appearance

Adrenals/Urinary Tract: Adrenal glands and RIGHT kidney normal
appearance. Low-attenuation region at mid LEFT kidney laterally,
cm diameter, likely small cyst. Kidneys, ureters and bladder
otherwise normal appearance. No urinary tract calcifications or
dilatation.

Stomach/Bowel: Stomach and bowel loops normal appearance. Normal
appendix.

Vascular/Lymphatic: Aorta normal caliber.  No adenopathy.

Reproductive: Unremarkable uterus and adnexa

Other: No free air or free fluid. No hernia or inflammatory process.

Musculoskeletal: Unremarkable
IMPRESSION: Probable 1.5 cm LEFT renal cyst.

Otherwise negative exam.

## 2022-02-01 NOTE — Therapy (Unsigned)
OUTPATIENT PHYSICAL THERAPY PROGRESS NOTE   Patient Name: Donna Santiago MRN: [id number] DOB:19-Apr-1986, 36 y.o., female Today's Date: 02/02/2022  PCP: Ellene Route  REFERRING PROVIDER: Ignacia Bayley PA-C   END OF SESSION:   PT End of Session - 02/01/22 1804     Visit Number 10    Number of Visits 12    Date for PT Re-Evaluation 03/30/22    Authorization Type Workers Comp    Authorization - Visit Number 9    Authorization - Number of Visits 12    Progress Note Due on Visit 6    PT Start Time 1800    PT Stop Time 0131    PT Time Calculation (min) 45 min    Equipment Utilized During Treatment Gait belt    Activity Tolerance Patient tolerated treatment well    Behavior During Therapy WFL for tasks assessed/performed             Past Medical History:  Diagnosis Date   Diabetes mellitus without complication ([place])    Hypertension    Hypothyroidism    Migraines    Morbid obesity with body mass index (BMI) of 50.0 to 59.9 in adult [facility])    Past Surgical History:  Procedure Laterality Date   DILATION AND CURETTAGE OF UTERUS     ESOPHAGOGASTRODUODENOSCOPY (EGD) WITH PROPOFOL N/A 08/05/2021   Procedure: ESOPHAGOGASTRODUODENOSCOPY (EGD) WITH PROPOFOL;  Surgeon: Annamaria Helling, DO;  Location: [facility] ENDOSCOPY;  Service: Gastroenterology;  Laterality: N/A;  DM   There are no problems to display for this patient.   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 No record of incident from provider in chart because EMR not shared.   PRECAUTIONS: None   SUBJECTIVE: Pt reports ongoing low back pain, and a recent visit to physician for workmen's comp who said he would approve her for more PT.    PAIN:  Are you having pain? Yes: NPRS scale: 5/10 Pain  location: Thoracic back on both sides  Pain description: Achy  Aggravating factors: Extension Relieving factors: Not sitting for long periods of time and extending back     OBJECTIVE:              VITALS: BP 130/87 HR 86 SpO2 100     DIAGNOSTIC FINDINGS:  MRI taken at [facility], pt reports no pinched nerves.    PATIENT SURVEYS:  FOTO 61/62 01/10/22: 63    SCREENING FOR RED FLAGS: Bowel or bladder incontinence: No Spinal tumors: No Cauda equina syndrome: No Compression fracture: No Abdominal aneurysm: No   COGNITION:           Overall cognitive status: Within functional limits for tasks assessed                          SENSATION: WFL Intermittent with pain spreading down left leg at night    MUSCLE LENGTH: Hamstrings: Right 60 deg; Left 60 deg Thomas test: Right NT deg; Left NT deg   POSTURE: rounded shoulders   PALPATION: Spinous Process of L1-L5    LUMBAR ROM:    Active  A/PROM  eval AROM  eval   Flexion 80% 90%  Extension 75%* 85%*  Right lateral flexion 100%   Left lateral flexion 100%   Right rotation 100%  Left rotation 100%    (Blank rows = not tested) *=Painful    LOWER EXTREMITY ROM:          Active  Right 10/24/2021 Left 10/24/2021  Hip flexion 120 120  Hip extension 20 20  Hip abduction 45 45  Hip adduction 30 30  Hip internal rotation 45 45  Hip external rotation 45 45  Knee flexion 135 135  Knee extension -5 -5  Ankle dorsiflexion 20 20  Ankle plantarflexion 50 50  Ankle inversion      Ankle eversion       (Blank rows = not tested)            LOWER EXTREMITY MMT:     MMT Right eval Left eval Right  01/10/22 Left  01/10/22  Hip flexion 5 4 5  4+  Hip extension 3+ 3+ 4+ 4+  Hip abduction 4 4 4+ 4+   Hip adduction 4 4 4 4   Hip internal rotation 5 5    Hip external rotation 5 5    Knee flexion 5 5    Knee extension 5 4  4+  Ankle dorsiflexion 5 5    Ankle plantarflexion        Ankle inversion        Ankle eversion          (Blank rows = not tested)     LUMBAR SPECIAL TESTS:  Straight leg raise test: Negative   FUNCTIONAL TESTS:  None    GAIT: Distance walked: 25 ft  Assistive device utilized: None Level of assistance: Complete Independence Comments: Waddle like gait with wide base of support        TODAY'S TREATMENT   02/01/22  Nu-Step Seat and Arm Level 11 at resistance level 1 for 6 min Lumbar Extension 85% Pallof Press #10 3 x 10  OMEGA Seated Rows #25 3 x 10   Seated Thoracic Extension 1 x 5  Wall Sit for 5 sec  -Pt reports increased pain in low back down left leg  Shoulder with Horizontal Abduction at 90 deg abd 1 x 10 with green TB  Shoulder with Horizontal Abduction at 90 deg abd 1 x 10 with red TB  Shoulder with Horizontal Abduction at 90 deg abd 1 x 10 with yellow TB    01/30/22  Nu-Step Seat and Arm Level 11 at resistance level 1 for 6 min   Lower Trunk Rotation 3 x 10   Mini-Squats from 23 inch mat height  1 x 10 -TC for tapping down on seat    Mini-Squats from 22 inch mat height 1 x 10   Standing hip abduction with UE support 3 x 10   01/24/22 Nu-Step Seat and Arm Level 11 at resistance level 1 for 6 min  Supine Bridges with Hip abduction using Green TB 3 x 10 Side Lying Hip abduction on left side with Green TB 2 x 10 -Pt reports increased left sided low back pain   Seated Hip Abduction with Black 3 x 10   Supine Abdominal Reaches 3 x 10   01/17/22 Nu-Step Seat and Arm Level 11 at resistance level 3 for 6 min  Runner Step Up on 4 inch platform with 1 UE support 2 x 10  Runner Step Up on 6 inch platform with 1 UE support 2 x 10  Runner Step Up on 8 inch platform with 1 UE support 2 x 10  Seated Hip ER on LLE 3 x 30 sec  Side Step Up 3 x 10 on 8 inch step   Omega Palloff Press with #10 2 x 10       PATIENT EDUCATION:  Education details: form and technique for appropriate exercise. Differentials for low back and hip pathologies and treatment.  Person  educated: Patient Education method: Explanation, Demonstration, Verbal cues, and Handouts Education comprehension: verbalized understanding, returned demonstration, and verbal cues required     HOME EXERCISE PROGRAM: Access Code: [email or web address] URL: [email or web address] Date: 02/01/2022 Prepared by: Bradly Chris  Exercises - Supine Lower Trunk Rotation  - 1 x daily - 7 x weekly - 3 sets - 10 reps - Modified Thomas Stretch  - 1 x daily - 7 x weekly - 1 sets - 3 reps - 30 hold - Seated Hip External Rotation Stretch  - 1 x daily - 7 x weekly - 1 sets - 3 reps - 30 hold - Curl Up with Reach  - 1 x daily - 3 x weekly - 3 sets - 10 reps - Mini Squat  - 1 x daily - 3 x weekly - 3 sets - 10 reps - Standing Hip Abduction with Counter Support  - 1 x daily - 3 x weekly - 3 sets - 10 reps - Standing Shoulder Horizontal Abduction with Resistance  - 1 x daily - 3 x weekly - 3 sets - 10 reps  ASSESSMENT:   CLINICAL IMPRESSION:  Despite continued improvement with abdominal and hip strength as evidence by pt's ability to complete exercises with increased resistance, she still experiences increased pain similar to what she was experiencing at initial eval with lumbar extension. Exercises modified according to include flexion based exercises to avoid provoking pt's symptoms. She has nearly met all goals with the exception of the extension goal and she has been given additional visits by supervising physician. 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  OBJECTIVE IMPAIRMENTS Abnormal gait, decreased mobility, difficulty walking, decreased strength, impaired flexibility, obesity, and pain.    ACTIVITY LIMITATIONS carrying, lifting, standing, sleeping, dressing, reach over head, hygiene/grooming, and caring for others   PARTICIPATION  LIMITATIONS: meal prep, cleaning, laundry, driving, community activity, occupation, and yard work   PERSONAL Education officer, community, Past/current experiences, Sex, Time since onset of injury/illness/exacerbation, and 1 comorbidity: Obesity  are also affecting patient's functional outcome.    REHAB POTENTIAL: Fair prior episode treatment for impairment    CLINICAL DECISION MAKING: Stable/uncomplicated   EVALUATION COMPLEXITY: Low     GOALS: Goals reviewed with patient? No   SHORT TERM GOALS: Target date: 12/15/2021   Pt will be independent with HEP in order to improve strength and balance in order to decrease fall risk and improve function at home and work. Baseline: Able to perform independently  Goal status: ONGOING        LONG TERM GOALS: Target date: 01/26/2022   Patient will have improved function and activity level as evidenced by an increase in FOTO score by 10 points or more. Baseline: 61/62  01/10/22: 63 Goal status: ACHIEVED    2.  Patient will improve hip and knee strength by 1/2 MMT grade to help offload structures in lower spine for improved activity tolerance and decreased pain response. Baseline: Hip Ext R/L 3+/3+, Hip Add R/L 4/4, Hip Abd 4/4, Hip Flex R 4, Knee Flex R 4  01/10/22: Hip R/L Ext 4+/4+, Abd 4+/4+ Knee, Hip R/L Add 4/4  Ext 4+ Goal status: ACHIEVED    3.  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  Baseline: Lumbar Extension 75% 01/10/22: Lumbar Ext 85% 02/01/22: Lumbar Ext 85% with pain  Goal status: PARTIALLY MET      PLAN: PT FREQUENCY: 1-2x/week   PT DURATION: 6 weeks   PLANNED INTERVENTIONS: Therapeutic exercises, Therapeutic activity, Neuromuscular re-education, Balance training, Gait training, Patient/Family education, Joint manipulation, Joint mobilization, Stair training, DME instructions, Aquatic Therapy, Dry Needling, Electrical stimulation,  Spinal manipulation, Spinal mobilization, Cryotherapy, Moist heat, Traction, Manual therapy, and Re-evaluation.   PLAN FOR NEXT SESSION:    Focus on flexion based exercises. Gait analysis, progress abdominal exercises and hip exercises     Bradly Chris PT, DPT  02/02/2022, 9:07 AM

## 2022-02-02 NOTE — Addendum Note (Signed)
Addended by: Johnn Hai on: 02/02/2022 09:01 AM   Modules accepted: Orders

## 2022-02-07 ENCOUNTER — Encounter: Payer: Self-pay | Admitting: Physical Therapy

## 2022-02-07 ENCOUNTER — Ambulatory Visit: Payer: Worker's Compensation | Admitting: Physical Therapy

## 2022-02-07 DIAGNOSIS — R262 Difficulty in walking, not elsewhere classified: Secondary | ICD-10-CM

## 2022-02-07 DIAGNOSIS — M5459 Other low back pain: Secondary | ICD-10-CM | POA: Diagnosis not present

## 2022-02-07 NOTE — Therapy (Addendum)
OUTPATIENT PHYSICAL THERAPY TREATMENT NOTE   Patient Name: Donna Santiago MRN: [id number] DOB:1986-05-18, 36 y.o., female Today's Date: 02/07/2022  PCP: Ellene Route  REFERRING PROVIDER: Ignacia Bayley PA-C   END OF SESSION:   PT End of Session - 02/07/22 0807     Visit Number 11    Number of Visits 12    Date for PT Re-Evaluation 03/30/22    Authorization Type Workers Comp    Authorization - Visit Number [place] - Number of Visits 12    Progress Note Due on Visit 6    PT Start Time 0805    PT Stop Time 0845    PT Time Calculation (min) 40 min    Equipment Utilized During Treatment Gait belt    Activity Tolerance Patient tolerated treatment well    Behavior During Therapy WFL for tasks assessed/performed             Past Medical History:  Diagnosis Date   Diabetes mellitus without complication ([place])    Hypertension    Hypothyroidism    Migraines    Morbid obesity with body mass index (BMI) of 50.0 to 59.9 in adult [facility])    Past Surgical History:  Procedure Laterality Date   DILATION AND CURETTAGE OF UTERUS     ESOPHAGOGASTRODUODENOSCOPY (EGD) WITH PROPOFOL N/A 08/05/2021   Procedure: ESOPHAGOGASTRODUODENOSCOPY (EGD) WITH PROPOFOL;  Surgeon: Annamaria Helling, DO;  Location: [facility] ENDOSCOPY;  Service: Gastroenterology;  Laterality: N/A;  DM   There are no problems to display for this patient.   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 No record of incident from provider in chart because EMR not shared.   PRECAUTIONS: None   SUBJECTIVE: Pt had a severe increase pain over the weekend while on a trip with her family. It was so painful that she had to stop walking while at the grocery store. Pt also experiences pain when  sitting down.  She rested yesterday and she feels better now.   PAIN:  Are you having pain? Yes: NPRS scale: 2-3/10 Pain location: Thoracic back on both sides  Pain description: Achy  Aggravating factors: Extension Relieving factors: Not sitting for long periods of time and extending back     OBJECTIVE:              VITALS: BP 130/87 HR 86 SpO2 100     DIAGNOSTIC FINDINGS:  MRI taken at [facility], pt reports no pinched nerves.    PATIENT SURVEYS:  FOTO 61/62 01/10/22: 63    SCREENING FOR RED FLAGS: Bowel or bladder incontinence: No Spinal tumors: No Cauda equina syndrome: No Compression fracture: No Abdominal aneurysm: No   COGNITION:           Overall cognitive status: Within functional limits for tasks assessed                          SENSATION: WFL Intermittent with pain spreading down left leg at night    MUSCLE LENGTH: Hamstrings: Right 60 deg; Left 60 deg Thomas test: Right NT deg; Left NT deg   POSTURE: rounded shoulders   PALPATION: Spinous Process of L1-L5    LUMBAR ROM:    Active  A/PROM  eval AROM  eval   Flexion 80%  90%  Extension 75%* 85%*  Right lateral flexion 100%   Left lateral flexion 100%   Right rotation 100%   Left rotation 100%    (Blank rows = not tested) *=Painful    LOWER EXTREMITY ROM:          Active  Right 10/24/2021 Left 10/24/2021  Hip flexion 120 120  Hip extension 20 20  Hip abduction 45 45  Hip adduction 30 30  Hip internal rotation 45 45  Hip external rotation 45 45  Knee flexion 135 135  Knee extension -5 -5  Ankle dorsiflexion 20 20  Ankle plantarflexion 50 50  Ankle inversion      Ankle eversion       (Blank rows = not tested)            LOWER EXTREMITY MMT:     MMT Right eval Left eval Right  01/10/22 Left  01/10/22  Hip flexion _0 4+  Hip extension 3+ 3+ 4+ 4+  Hip abduction 4 4 4+ 4+   Hip adduction _1 Hip internal rotation 5 5    Hip external rotation 5 5    Knee flexion 5 5     Knee extension 5 4  4+  Ankle dorsiflexion 5 5    Ankle plantarflexion        Ankle inversion        Ankle eversion         (Blank rows = not tested)     LUMBAR SPECIAL TESTS:  Straight leg raise test: Negative   FUNCTIONAL TESTS:  None    GAIT: Distance walked: 25 ft  Assistive device utilized: None Level of assistance: Complete Independence Comments: Waddle like gait with wide base of support        TODAY'S TREATMENT   02/07/22 Nu-Step Level 11 for arms and seat 5 min Lower Trunk Rotations 3 x 10  Bicycles 3 x 5  Double Leg Corkscrews 3 x 5  TRX Squats 3 x 10  -min to mod VC and TC with placement of chair to cue posterior weight shift  Standing Hip Abduction with BUE support and use of yellow band 3 x 10    02/01/22  Nu-Step Seat and Arm Level 11 at resistance level 1 for 6 min Lumbar Extension 85% Pallof Press #10 3 x 10  OMEGA Seated Rows #25 3 x 10   Seated Thoracic Extension 1 x 5  Wall Sit for 5 sec  -Pt reports increased pain in low back down left leg  Shoulder with Horizontal Abduction at 90 deg abd 1 x 10 with green TB  Shoulder with Horizontal Abduction at 90 deg abd 1 x 10 with red TB  Shoulder with Horizontal Abduction at 90 deg abd 1 x 10 with yellow TB    01/30/22  Nu-Step Seat and Arm Level 11 at resistance level 1 for 6 min   Lower Trunk Rotation 3 x 10   Mini-Squats from 23 inch mat height  1 x 10 -TC for tapping down on seat    Mini-Squats from 22 inch mat height 1 x 10   Standing hip abduction with UE support 3 x 10   01/24/22 Nu-Step Seat and Arm Level 11 at resistance level 1 for 6 min  Supine Bridges with Hip abduction using Green TB 3 x 10 Side Lying Hip abduction on left side with Green TB 2 x 10 -Pt reports increased left sided low  back pain   Seated Hip Abduction with Black 3 x 10   Supine Abdominal Reaches 3 x 10     PATIENT EDUCATION:  Education details: form and technique for appropriate exercise. Differentials for low  back and hip pathologies and treatment.  Person educated: Patient Education method: Explanation, Demonstration, Verbal cues, and Handouts Education comprehension: verbalized understanding, returned demonstration, and verbal cues required     HOME EXERCISE PROGRAM: [email or web address]  ASSESSMENT:   CLINICAL IMPRESSION:  Pt exhibits improved pain response to activity despite recent increase in pain over weekend. She shows improved abdominal strength and hip strength with ability to perform rotational movement with sit ups and standing hip abduction against resistance. Pt did experience some left sided low back pain but this resolved with stretching indicating muscular tension as origin of pain. 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   OBJECTIVE IMPAIRMENTS Abnormal gait, decreased mobility, difficulty walking, decreased strength, impaired flexibility, obesity, and pain.    ACTIVITY LIMITATIONS carrying, lifting, standing, sleeping, dressing, reach over head, hygiene/grooming, and caring for others   PARTICIPATION LIMITATIONS: meal prep, cleaning, laundry, driving, community activity, occupation, and yard work   PERSONAL Education officer, community, Past/current experiences, Sex, Time since onset of injury/illness/exacerbation, and 1 comorbidity: Obesity  are also affecting patient's functional outcome.    REHAB POTENTIAL: Fair prior episode treatment for impairment    CLINICAL DECISION MAKING: Stable/uncomplicated   EVALUATION COMPLEXITY: Low     GOALS: Goals reviewed with patient? No   SHORT TERM GOALS: Target date: 12/15/2021   Pt will be independent with HEP in order to improve strength and balance in order to decrease fall risk and improve function at home and work. Baseline: Able to perform independently  Goal status: ONGOING         LONG TERM GOALS: Target date: 01/26/2022   Patient will have improved function and activity level as evidenced by an increase in FOTO score by 10 points or more. Baseline: 61/62  01/10/22: 63 Goal status: ACHIEVED    2.  Patient will improve hip and knee strength by 1/2 MMT grade to help offload structures in lower spine for improved activity tolerance and decreased pain response. Baseline: Hip Ext R/L 3+/3+, Hip Add R/L 4/4, Hip Abd 4/4, Hip Flex R 4, Knee Flex R 4  01/10/22: Hip R/L Ext 4+/4+, Abd 4+/4+ Knee, Hip R/L Add 4/4  Ext 4+ Goal status: ACHIEVED    3.  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  Baseline: Lumbar Extension 75% 01/10/22: Lumbar Ext 85% 02/01/22: Lumbar Ext 85% with pain  Goal status: PARTIALLY MET      PLAN: PT FREQUENCY: 1-2x/week   PT DURATION: 6 weeks   PLANNED INTERVENTIONS: Therapeutic exercises, Therapeutic activity, Neuromuscular re-education, Balance training, Gait training, Patient/Family education, Joint manipulation, Joint mobilization, Stair training, DME instructions, Aquatic Therapy, Dry Needling, Electrical stimulation, Spinal manipulation, Spinal mobilization, Cryotherapy, Moist heat, Traction, Manual therapy, and Re-evaluation.   PLAN FOR NEXT SESSION:   Progress hip and abdominal exercises . Gait analysis.    Bradly Chris PT, DPT  02/07/2022, 8:09 AM

## 2022-02-09 ENCOUNTER — Encounter: Payer: Commercial Managed Care - HMO | Admitting: Physical Therapy

## 2022-02-13 ENCOUNTER — Ambulatory Visit: Payer: Worker's Compensation | Admitting: Occupational Therapy

## 2022-02-13 ENCOUNTER — Ambulatory Visit: Payer: Worker's Compensation | Admitting: Physical Therapy

## 2022-02-15 ENCOUNTER — Ambulatory Visit: Payer: Worker's Compensation | Admitting: Physical Therapy

## 2022-03-08 ENCOUNTER — Ambulatory Visit: Payer: Worker's Compensation | Admitting: Physical Therapy

## 2022-03-13 ENCOUNTER — Ambulatory Visit: Payer: Worker's Compensation | Admitting: Physical Therapy

## 2022-03-15 ENCOUNTER — Encounter: Payer: Self-pay | Admitting: Physical Therapy

## 2022-03-15 ENCOUNTER — Ambulatory Visit: Payer: Worker's Compensation | Attending: Physician Assistant | Admitting: Physical Therapy

## 2022-03-15 DIAGNOSIS — R262 Difficulty in walking, not elsewhere classified: Secondary | ICD-10-CM | POA: Diagnosis present

## 2022-03-15 DIAGNOSIS — M5459 Other low back pain: Secondary | ICD-10-CM | POA: Diagnosis present

## 2022-03-15 NOTE — Therapy (Signed)
OUTPATIENT PHYSICAL THERAPY TREATMENT NOTE   Patient Name: Donna Santiago MRN: [id number] DOB:1985/07/20, 36 y.o., female Today's Date: 03/15/2022  PCP: Ellene Route  REFERRING PROVIDER: Ignacia Bayley PA-C   END OF SESSION:   PT End of Session - 03/15/22 1805     Visit Number 12    Number of Visits 15    Date for PT Re-Evaluation 03/30/22    Authorization Type Workers Comp    Authorization - Visit Number 12    Authorization - Number of Visits 24    Progress Note Due on Visit 15    PT Start Time 1800    PT Stop Time 0814    PT Time Calculation (min) 45 min    Equipment Utilized During Treatment Gait belt    Activity Tolerance Patient tolerated treatment well    Behavior During Therapy WFL for tasks assessed/performed             Past Medical History:  Diagnosis Date   Diabetes mellitus without complication ([place])    Hypertension    Hypothyroidism    Migraines    Morbid obesity with body mass index (BMI) of 50.0 to 59.9 in adult [facility])    Past Surgical History:  Procedure Laterality Date   DILATION AND CURETTAGE OF UTERUS     ESOPHAGOGASTRODUODENOSCOPY (EGD) WITH PROPOFOL N/A 08/05/2021   Procedure: ESOPHAGOGASTRODUODENOSCOPY (EGD) WITH PROPOFOL;  Surgeon: Annamaria Helling, DO;  Location: [facility] ENDOSCOPY;  Service: Gastroenterology;  Laterality: N/A;  DM   There are no problems to display for this patient.   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 No record of incident from provider in chart because EMR not shared.   PRECAUTIONS: None   SUBJECTIVE: Patient reports that her low back pain continues to be severe especially when standing all day or driving for a long time.   PAIN:  Are you having pain? Yes: NPRS scale: 6/10 Pain  location: Thoracic back on both sides  Pain description: Achy  Aggravating factors: Extension Relieving factors: Not sitting for long periods of time and extending back     OBJECTIVE:              VITALS: BP 130/87 HR 86 SpO2 100     DIAGNOSTIC FINDINGS:  MRI taken at [facility], pt reports no pinched nerves.    PATIENT SURVEYS:  FOTO 61/62 01/10/22: 63    SCREENING FOR RED FLAGS: Bowel or bladder incontinence: No Spinal tumors: No Cauda equina syndrome: No Compression fracture: No Abdominal aneurysm: No   COGNITION:           Overall cognitive status: Within functional limits for tasks assessed                          SENSATION: WFL Intermittent with pain spreading down left leg at night    MUSCLE LENGTH: Hamstrings: Right 60 deg; Left 60 deg Thomas test: Right NT deg; Left NT deg   POSTURE: rounded shoulders   PALPATION: Spinous Process of L1-L5    LUMBAR ROM:    Active  A/PROM  eval AROM  eval   Flexion 80% 90%  Extension 75%* 85%*  Right lateral flexion 100%   Left lateral flexion 100%   Right rotation 100%   Left rotation  100%    (Blank rows = not tested) *=Painful    LOWER EXTREMITY ROM:          Active  Right 10/24/2021 Left 10/24/2021  Hip flexion 120 120  Hip extension 20 20  Hip abduction 45 45  Hip adduction 30 30  Hip internal rotation 45 45  Hip external rotation 45 45  Knee flexion 135 135  Knee extension -5 -5  Ankle dorsiflexion 20 20  Ankle plantarflexion 50 50  Ankle inversion      Ankle eversion       (Blank rows = not tested)            LOWER EXTREMITY MMT:     MMT Right eval Left eval Right  01/10/22 Left  01/10/22 Right 03/15/22  Left  03/15/22  Hip flexion _0 4+ 5 4+   Hip extension 3+ 3+ 4+ 4+    Hip abduction 4 4 4+ 4+  4- 4  Hip adduction _1 3+   Hip internal rotation 5 5      Hip external rotation 5 5      Knee flexion 5 5      Knee extension 5 4  4+    Ankle dorsiflexion 5 5      Ankle  plantarflexion          Ankle inversion          Ankle eversion           (Blank rows = not tested)     LUMBAR SPECIAL TESTS:  Straight leg raise test: Negative   FUNCTIONAL TESTS:  None    GAIT: Distance walked: 25 ft  Assistive device utilized: None Level of assistance: Complete Independence Comments: Waddle like gait with wide base of support        TODAY'S TREATMENT   03/15/22 See MMT above  Supine Bridges 3 x 10  Lower Trunk Rotation 3 x 10  Supine Crunches with arms crossed 3 x 10  Seated Hip ER 3 x 30 sec  Seated Rows with Gray Band 3 x 10   02/07/22 Nu-Step Level 11 for arms and seat 5 min Lower Trunk Rotations 3 x 10  Bicycles 3 x 5  Double Leg Corkscrews 3 x 5  TRX Squats 3 x 10  -min to mod VC and TC with placement of chair to cue posterior weight shift  Standing Hip Abduction with BUE support and use of yellow band 3 x 10    02/01/22  Nu-Step Seat and Arm Level 11 at resistance level 1 for 6 min Lumbar Extension 85% Pallof Press #10 3 x 10  OMEGA Seated Rows #25 3 x 10   Seated Thoracic Extension 1 x 5  Wall Sit for 5 sec  -Pt reports increased pain in low back down left leg  Shoulder with Horizontal Abduction at 90 deg abd 1 x 10 with green TB  Shoulder with Horizontal Abduction at 90 deg abd 1 x 10 with red TB  Shoulder with Horizontal Abduction at 90 deg abd 1 x 10 with yellow TB    01/30/22  Nu-Step Seat and Arm Level 11 at resistance level 1 for 6 min   Lower Trunk Rotation 3 x 10   Mini-Squats from 23 inch mat height  1 x 10 -TC for tapping down on seat    Mini-Squats from 22 inch mat height 1 x 10   Standing hip abduction with UE  support 3 x 10   01/24/22 Nu-Step Seat and Arm Level 11 at resistance level 1 for 6 min  Supine Bridges with Hip abduction using Green TB 3 x 10 Side Lying Hip abduction on left side with Green TB 2 x 10 -Pt reports increased left sided low back pain   Seated Hip Abduction with Black 3 x 10   Supine  Abdominal Reaches 3 x 10     PATIENT EDUCATION:  Education details: form and technique for appropriate exercise. Differentials for low back and hip pathologies and treatment.  Person educated: Patient Education method: Explanation, Demonstration, Verbal cues, and Handouts Education comprehension: verbalized understanding, returned demonstration, and verbal cues required     HOME EXERCISE PROGRAM: Access Code: [email or web address] URL: [email or web address] Date: 03/15/2022 Prepared by: Bradly Chris  Exercises - Supine Lower Trunk Rotation  - 1 x daily - 7 x weekly - 3 sets - 10 reps - Modified Thomas Stretch  - 1 x daily - 7 x weekly - 1 sets - 3 reps - 30 hold - Seated Hip External Rotation Stretch  - 1 x daily - 7 x weekly - 1 sets - 3 reps - 30 hold - Curl Up with Reach  - 1 x daily - 3 x weekly - 3 sets - 10 reps - Mini Squat  - 1 x daily - 3 x weekly - 3 sets - 10 reps - Standing Hip Abduction with Counter Support  - 1 x daily - 3 x weekly - 3 sets - 10 reps - Standing Shoulder Horizontal Abduction with Resistance  - 1 x daily - 3 x weekly - 3 sets - 10 reps - Seated Shoulder Row with Anchored Resistance  - 1 x daily - 3 x weekly - 3 sets - 10 reps  ASSESSMENT:   CLINICAL IMPRESSION: Session focused on reviewing HEP and reassessing goals. Pt has not shown an improvement in LE strength since last session, but this is likely due to prolonged hiatus. She was able to perform all exercises without an increase in her low back pain. 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   OBJECTIVE IMPAIRMENTS Abnormal gait, decreased mobility, difficulty walking, decreased strength, impaired flexibility, obesity, and pain.    ACTIVITY LIMITATIONS carrying, lifting, standing, sleeping, dressing, reach over head,  hygiene/grooming, and caring for others   PARTICIPATION LIMITATIONS: meal prep, cleaning, laundry, driving, community activity, occupation, and yard work   PERSONAL Education officer, community, Past/current experiences, Sex, Time since onset of injury/illness/exacerbation, and 1 comorbidity: Obesity  are also affecting patient's functional outcome.    REHAB POTENTIAL: Fair prior episode treatment for impairment    CLINICAL DECISION MAKING: Stable/uncomplicated   EVALUATION COMPLEXITY: Low     GOALS: Goals reviewed with patient? No   SHORT TERM GOALS: Target date: 12/15/2021   Pt will be independent with HEP in order to improve strength and balance in order to decrease fall risk and improve function at home and work. Baseline: Able to perform independently  Goal status: ONGOING        LONG TERM GOALS: Target date: 01/26/2022   Patient will have improved function and activity level as evidenced by an increase in FOTO score by 10 points or more. Baseline: 61/62  01/10/22: 63 Goal status: ACHIEVED    2.  Patient will improve hip and  knee strength by 1/2 MMT grade to help offload structures in lower spine for improved activity tolerance and decreased pain response. Baseline: Hip Ext R/L 3+/3+, Hip Add R/L 4/4, Hip Abd 4/4, Hip Flex R 4, Knee Flex R 4  01/10/22: Hip R/L Ext 4+/4+, Abd 4+/4+ Knee, Hip R/L Add 4/4  Ext 4+ 03/16/22: Hip Flex R/L 5/4+, Hip Abd R/L 4-/4, Hip Add L 3+ Goal status: ACHIEVED    3.  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  Baseline: Lumbar Extension 75% 01/10/22: Lumbar Ext 85% 02/01/22: Lumbar Ext 85% with pain  Goal status: PARTIALLY MET      PLAN: PT FREQUENCY: 1-2x/week   PT DURATION: 6 weeks   PLANNED INTERVENTIONS: Therapeutic exercises, Therapeutic activity, Neuromuscular re-education, Balance training, Gait training, Patient/Family education, Joint  manipulation, Joint mobilization, Stair training, DME instructions, Aquatic Therapy, Dry Needling, Electrical stimulation, Spinal manipulation, Spinal mobilization, Cryotherapy, Moist heat, Traction, Manual therapy, and Re-evaluation.   PLAN FOR NEXT SESSION:   Progress hip and abdominal exercises . Gait analysis.    Bradly Chris PT, DPT  03/15/2022, 6:07 PM

## 2022-03-20 ENCOUNTER — Ambulatory Visit: Payer: Worker's Compensation | Admitting: Physical Therapy

## 2022-03-20 DIAGNOSIS — M5459 Other low back pain: Secondary | ICD-10-CM | POA: Diagnosis not present

## 2022-03-20 DIAGNOSIS — R262 Difficulty in walking, not elsewhere classified: Secondary | ICD-10-CM

## 2022-03-20 NOTE — Therapy (Unsigned)
OUTPATIENT PHYSICAL THERAPY TREATMENT NOTE   Patient Name: Donna Santiago MRN: [id number] DOB:September 05, 1985, 36 y.o., female Today's Date: 03/21/2022  PCP: Ellene Route  REFERRING PROVIDER: Ignacia Bayley PA-C   END OF SESSION:   PT End of Session - 03/20/22 1805     Visit Number 13    Number of Visits 15    Date for PT Re-Evaluation 03/30/22    Authorization Type Workers Comp    Authorization - Visit Number [place] - Number of Visits 24    Progress Note Due on Visit 15    PT Start Time 1805    PT Stop Time 1845    PT Time Calculation (min) 40 min    Equipment Utilized During Treatment Gait belt    Activity Tolerance Patient tolerated treatment well    Behavior During Therapy WFL for tasks assessed/performed              Past Medical History:  Diagnosis Date   Diabetes mellitus without complication ([place])    Hypertension    Hypothyroidism    Migraines    Morbid obesity with body mass index (BMI) of 50.0 to 59.9 in adult [facility])    Past Surgical History:  Procedure Laterality Date   DILATION AND CURETTAGE OF UTERUS     ESOPHAGOGASTRODUODENOSCOPY (EGD) WITH PROPOFOL N/A 08/05/2021   Procedure: ESOPHAGOGASTRODUODENOSCOPY (EGD) WITH PROPOFOL;  Surgeon: Annamaria Helling, DO;  Location: [facility] ENDOSCOPY;  Service: Gastroenterology;  Laterality: N/A;  DM   There are no problems to display for this patient.   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 No record of incident from provider in chart because EMR not shared.   PRECAUTIONS: None   SUBJECTIVE: Pt states she was busy at work today and that she was cleaning around house.   PAIN:  Are you having pain? Yes: NPRS scale: 5-6/10 Pain location: Thoracic back on both sides  Pain  description: Achy  Aggravating factors: Extension Relieving factors: Not sitting for long periods of time and extending back     OBJECTIVE:              VITALS: BP 130/87 HR 86 SpO2 100     DIAGNOSTIC FINDINGS:  MRI taken at [facility], pt reports no pinched nerves.    PATIENT SURVEYS:  FOTO 61/62 01/10/22: 63    SCREENING FOR RED FLAGS: Bowel or bladder incontinence: No Spinal tumors: No Cauda equina syndrome: No Compression fracture: No Abdominal aneurysm: No   COGNITION:           Overall cognitive status: Within functional limits for tasks assessed                          SENSATION: WFL Intermittent with pain spreading down left leg at night    MUSCLE LENGTH: Hamstrings: Right 60 deg; Left 60 deg Thomas test: Right NT deg; Left NT deg   POSTURE: rounded shoulders   PALPATION: Spinous Process of L1-L5    LUMBAR ROM:    Active  A/PROM  eval AROM  eval   Flexion 80% 90%  Extension 75%* 85%*  Right lateral flexion 100%   Left lateral flexion 100%   Right rotation 100%   Left rotation 100%    (Blank rows =  not tested) *=Painful    LOWER EXTREMITY ROM:          Active  Right 10/24/2021 Left 10/24/2021  Hip flexion 120 120  Hip extension 20 20  Hip abduction 45 45  Hip adduction 30 30  Hip internal rotation 45 45  Hip external rotation 45 45  Knee flexion 135 135  Knee extension -5 -5  Ankle dorsiflexion 20 20  Ankle plantarflexion 50 50  Ankle inversion      Ankle eversion       (Blank rows = not tested)            LOWER EXTREMITY MMT:     MMT Right eval Left eval Right  01/10/22 Left  01/10/22 Right 03/15/22  Left  03/15/22  Hip flexion 5 4 5  4+ 5 4+   Hip extension 3+ 3+ 4+ 4+    Hip abduction 4 4 4+ 4+  4- 4  Hip adduction 4 4 4 4  3+   Hip internal rotation 5 5      Hip external rotation 5 5      Knee flexion 5 5      Knee extension 5 4  4+    Ankle dorsiflexion 5 5      Ankle plantarflexion          Ankle inversion           Ankle eversion           (Blank rows = not tested)     LUMBAR SPECIAL TESTS:  Straight leg raise test: Negative   FUNCTIONAL TESTS:  None    GAIT: Distance walked: 25 ft  Assistive device utilized: None Level of assistance: Complete Independence Comments: Waddle like gait with wide base of support        TODAY'S TREATMENT   03/20/22 Nu-Step Level 8 for arms and legs for 5 min  OMEGA Palloff Press #10 3 x 10  OMEGA Seated Rows #35 1 x 10, #45 1 x 10, #40 1 x 10  OMEGA Chest Press #15 3 x 10   03/15/22 See MMT above  Supine Bridges 3 x 10  Lower Trunk Rotation 3 x 10  Supine Crunches with arms crossed 3 x 10  Seated Hip ER 3 x 30 sec  Seated Rows with Gray Band 3 x 10   02/07/22 Nu-Step Level 11 for arms and seat 5 min Lower Trunk Rotations 3 x 10  Bicycles 3 x 5  Double Leg Corkscrews 3 x 5  TRX Squats 3 x 10  -min to mod VC and TC with placement of chair to cue posterior weight shift  Standing Hip Abduction with BUE support and use of yellow band 3 x 10    PATIENT EDUCATION:  Education details: form and technique for appropriate exercise. Differentials for low back and hip pathologies and treatment.  Person educated: Patient Education method: Explanation, Demonstration, Verbal cues, and Handouts Education comprehension: verbalized understanding, returned demonstration, and verbal cues required     HOME EXERCISE PROGRAM: Access Code: [email or web address] URL: [email or web address] Date: 03/15/2022 Prepared by: Bradly Chris  Exercises - Supine Lower Trunk Rotation  - 1 x daily - 7 x weekly - 3 sets - 10 reps - Modified Thomas Stretch  - 1 x daily - 7 x weekly - 1 sets - 3 reps - 30 hold - Seated Hip External Rotation Stretch  - 1 x daily - 7 x weekly - 1 sets - 3 reps - 30  hold - Curl Up with Reach  - 1 x daily - 3 x weekly - 3 sets - 10 reps - Mini Squat  - 1 x daily - 3 x weekly - 3 sets - 10 reps - Standing Hip Abduction with Counter Support  - 1 x  daily - 3 x weekly - 3 sets - 10 reps - Standing Shoulder Horizontal Abduction with Resistance  - 1 x daily - 3 x weekly - 3 sets - 10 reps - Seated Shoulder Row with Anchored Resistance  - 1 x daily - 3 x weekly - 3 sets - 10 reps  ASSESSMENT:   CLINICAL IMPRESSION: Pt able to complete all strengthening exercises without an increase in her low back pain. Session focused on strengthening upper body portion of the posterior chain. 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   OBJECTIVE IMPAIRMENTS Abnormal gait, decreased mobility, difficulty walking, decreased strength, impaired flexibility, obesity, and pain.    ACTIVITY LIMITATIONS carrying, lifting, standing, sleeping, dressing, reach over head, hygiene/grooming, and caring for others   PARTICIPATION LIMITATIONS: meal prep, cleaning, laundry, driving, community activity, occupation, and yard work   PERSONAL Education officer, community, Past/current experiences, Sex, Time since onset of injury/illness/exacerbation, and 1 comorbidity: Obesity  are also affecting patient's functional outcome.    REHAB POTENTIAL: Fair prior episode treatment for impairment    CLINICAL DECISION MAKING: Stable/uncomplicated   EVALUATION COMPLEXITY: Low     GOALS: Goals reviewed with patient? No   SHORT TERM GOALS: Target date: 12/15/2021   Pt will be independent with HEP in order to improve strength and balance in order to decrease fall risk and improve function at home and work. Baseline: Able to perform independently  Goal status: ONGOING        LONG TERM GOALS: Target date: 01/26/2022   Patient will have improved function and activity level as evidenced by an increase in FOTO score by 10 points or more. Baseline: 61/62  01/10/22: 63 Goal status: ACHIEVED    2.  Patient will improve hip and  knee strength by 1/2 MMT grade to help offload structures in lower spine for improved activity tolerance and decreased pain response. Baseline: Hip Ext R/L 3+/3+, Hip Add R/L 4/4, Hip Abd 4/4, Hip Flex R 4, Knee Flex R 4  01/10/22: Hip R/L Ext 4+/4+, Abd 4+/4+ Knee, Hip R/L Add 4/4  Ext 4+ 03/16/22: Hip Flex R/L 5/4+, Hip Abd R/L 4-/4, Hip Add L 3+ Goal status: ACHIEVED    3.  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  Baseline: Lumbar Extension 75% 01/10/22: Lumbar Ext 85% 02/01/22: Lumbar Ext 85% with pain  Goal status: PARTIALLY MET      PLAN: PT FREQUENCY: 1-2x/week   PT DURATION: 6 weeks   PLANNED INTERVENTIONS: Therapeutic exercises, Therapeutic activity, Neuromuscular re-education, Balance training, Gait training, Patient/Family education, Joint manipulation, Joint mobilization, Stair training, DME instructions, Aquatic Therapy, Dry Needling, Electrical stimulation, Spinal manipulation, Spinal mobilization, Cryotherapy, Moist heat, Traction, Manual therapy, and Re-evaluation.   PLAN FOR NEXT SESSION:   Progress hip and abdominal exercises     Bradly Chris PT, DPT  03/21/2022, 9:15 AM

## 2022-03-22 ENCOUNTER — Ambulatory Visit: Payer: Worker's Compensation | Admitting: Physical Therapy

## 2022-03-22 DIAGNOSIS — M5459 Other low back pain: Secondary | ICD-10-CM

## 2022-03-22 DIAGNOSIS — R262 Difficulty in walking, not elsewhere classified: Secondary | ICD-10-CM

## 2022-03-22 NOTE — Therapy (Addendum)
OUTPATIENT PHYSICAL THERAPY TREATMENT NOTE/ Re-Certification  Dates of Reporting: 12/01/21-03/30/22   Patient Name: Donna Santiago MRN: [id number] DOB:[birth year], 36 y.o., female Today's Date: 03/22/2022  PCP: Ellene Route  REFERRING PROVIDER: Ignacia Bayley PA-C   END OF SESSION:   PT End of Session - 03/22/22 1808     Visit Number 14    Number of Visits 24   Date for PT Re-Evaluation 05/30/22    Authorization Type Workers Comp    Authorization - Visit Number [place] - Number of Visits 24    Progress Note Due on Visit 15    PT Start Time 1805    PT Stop Time 1845    PT Time Calculation (min) 40 min    Equipment Utilized During Treatment Gait belt    Activity Tolerance Patient tolerated treatment well    Behavior During Therapy WFL for tasks assessed/performed              Past Medical History:  Diagnosis Date   Diabetes mellitus without complication ([place])    Hypertension    Hypothyroidism    Migraines    Morbid obesity with body mass index (BMI) of 50.0 to 59.9 in adult [facility])    Past Surgical History:  Procedure Laterality Date   DILATION AND CURETTAGE OF UTERUS     ESOPHAGOGASTRODUODENOSCOPY (EGD) WITH PROPOFOL N/A 08/05/2021   Procedure: ESOPHAGOGASTRODUODENOSCOPY (EGD) WITH PROPOFOL;  Surgeon: Annamaria Helling, DO;  Location: [facility] ENDOSCOPY;  Service: Gastroenterology;  Laterality: N/A;  DM   There are no problems to display for this patient.   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 No record of incident from provider in chart because EMR not shared.   PRECAUTIONS: None   SUBJECTIVE: Pt reports feeling tired from a long day at work. She saw physician in charge of work comp for her back. He wants her to continue  with PT for next 8 weeks and then return to PA in 4 weeks and him in 8 weeks for re-evaluation.   PAIN:  Are you having pain? Yes: NPRS scale: 5-6/10 Pain location: Thoracic back on both sides  Pain description: Achy  Aggravating factors: Extension Relieving factors: Not sitting for long periods of time and extending back     OBJECTIVE:              VITALS: BP 130/87 HR 86 SpO2 100     DIAGNOSTIC FINDINGS:  MRI taken at [facility], pt reports no pinched nerves.    PATIENT SURVEYS:  FOTO 61/62 01/10/22: 63    SCREENING FOR RED FLAGS: Bowel or bladder incontinence: No Spinal tumors: No Cauda equina syndrome: No Compression fracture: No Abdominal aneurysm: No   COGNITION:           Overall cognitive status: Within functional limits for tasks assessed                          SENSATION: WFL Intermittent with pain spreading down left leg at night    MUSCLE LENGTH: Hamstrings: Right 60 deg; Left 60 deg Thomas test: Right NT deg; Left NT deg   POSTURE: rounded shoulders   PALPATION: Spinous Process of L1-L5    LUMBAR ROM:    Active  A/PROM  eval AROM  eval   Flexion 80% 90%  Extension 75%* 85%*  Right lateral flexion 100%   Left lateral flexion 100%   Right rotation 100%   Left rotation 100%    (Blank rows = not tested) *=Painful    LOWER EXTREMITY ROM:          Active  Right 10/24/2021 Left 10/24/2021  Hip flexion 120 120  Hip extension 20 20  Hip abduction 45 45  Hip adduction 30 30  Hip internal rotation 45 45  Hip external rotation 45 45  Knee flexion 135 135  Knee extension -5 -5  Ankle dorsiflexion 20 20  Ankle plantarflexion 50 50  Ankle inversion      Ankle eversion       (Blank rows = not tested)            LOWER EXTREMITY MMT:     MMT Right eval Left eval Right  01/10/22 Left  01/10/22 Right 03/15/22  Left  03/15/22  Hip flexion _0 4+ 5 4+   Hip extension 3+ 3+ 4+ 4+    Hip abduction 4 4 4+ 4+  4- 4  Hip adduction _1 3+   Hip internal rotation 5 5      Hip external rotation 5 5      Knee flexion 5 5      Knee extension 5 4  4+    Ankle dorsiflexion 5 5      Ankle plantarflexion          Ankle inversion          Ankle eversion           (Blank rows = not tested)     LUMBAR SPECIAL TESTS:  Straight leg raise test: Negative   FUNCTIONAL TESTS:  None    GAIT: Distance walked: 25 ft  Assistive device utilized: None Level of assistance: Complete Independence Comments: Waddle like gait with wide base of support        TODAY'S TREATMENT   03/22/22  Nu-Step level 8 for arms and legs for 5 min Mini-Squat with BUE support 26 inch mat height 1 x 10  Mini-Squat with BUE support 25 inch mat height 1 x 10  Mini-Squat with BUE support 22 inch mat height 1 x 10  Standing Hip Abduction with yellow band 3 x 10  Seated Hip ER 6 x 30 sec  Seated HS stretch 6 x 30 sec    03/20/22 Nu-Step Level 8 for arms and legs for 5 min  OMEGA Palloff Press #10 3 x 10  OMEGA Seated Rows #35 1 x 10, #45 1 x 10, #40 1 x 10  OMEGA Chest Press #15 3 x 10   03/15/22 See MMT above  Supine Bridges 3 x 10  Lower Trunk Rotation 3 x 10  Supine Crunches with arms crossed 3 x 10  Seated Hip ER 3 x 30 sec  Seated Rows with Gray Band 3 x 10   02/07/22 Nu-Step Level 11 for arms and seat 5 min Lower Trunk Rotations 3 x 10  Bicycles 3 x 5  Double Leg Corkscrews 3 x 5  TRX Squats 3 x 10  -min to mod VC and TC with placement of chair to cue posterior weight shift  Standing Hip Abduction with BUE support and use of yellow band 3 x 10    PATIENT EDUCATION:  Education details: form and technique for appropriate exercise. Differentials for low  back and hip pathologies and treatment.  Person educated: Patient Education method: Explanation, Demonstration, Verbal cues, and Handouts Education comprehension: verbalized understanding, returned demonstration, and verbal cues required     HOME EXERCISE PROGRAM: Access Code:  [email or web address] URL: [email or web address] Date: 03/15/2022 Prepared by: Bradly Chris  Exercises - Supine Lower Trunk Rotation  - 1 x daily - 7 x weekly - 3 sets - 10 reps - Modified Thomas Stretch  - 1 x daily - 7 x weekly - 1 sets - 3 reps - 30 hold - Seated Hip External Rotation Stretch  - 1 x daily - 7 x weekly - 1 sets - 3 reps - 30 hold - Curl Up with Reach  - 1 x daily - 3 x weekly - 3 sets - 10 reps - Mini Squat  - 1 x daily - 3 x weekly - 3 sets - 10 reps - Standing Hip Abduction with Counter Support  - 1 x daily - 3 x weekly - 3 sets - 10 reps - Standing Shoulder Horizontal Abduction with Resistance  - 1 x daily - 3 x weekly - 3 sets - 10 reps - Seated Shoulder Row with Anchored Resistance  - 1 x daily - 3 x weekly - 3 sets - 10 reps  ASSESSMENT:   CLINICAL IMPRESSION: Pt continues to tolerate hip strengthening and stretching exercises without an increase in her pain. Pt requires intermittent min VC to avoid compensation especially for rotation with hip abduction anterior translation of knees with mini-squat. 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   OBJECTIVE IMPAIRMENTS Abnormal gait, decreased mobility, difficulty walking, decreased strength, impaired flexibility, obesity, and pain.    ACTIVITY LIMITATIONS carrying, lifting, standing, sleeping, dressing, reach over head, hygiene/grooming, and caring for others   PARTICIPATION LIMITATIONS: meal prep, cleaning, laundry, driving, community activity, occupation, and yard work   PERSONAL Education officer, community, Past/current experiences, Sex, Time since onset of injury/illness/exacerbation, and 1 comorbidity: Obesity  are also affecting patient's functional outcome.    REHAB POTENTIAL: Fair prior episode treatment for impairment     CLINICAL DECISION MAKING: Stable/uncomplicated   EVALUATION COMPLEXITY: Low     GOALS: Goals reviewed with patient? No   SHORT TERM GOALS: Target date: 12/15/2021   Pt will be independent with HEP in order to improve strength and balance in order to decrease fall risk and improve function at home and work. Baseline: Able to perform independently  Goal status: ONGOING        LONG TERM GOALS: Target date: 01/26/2022   Patient will have improved function and activity level as evidenced by an increase in FOTO score by 10 points or more. Baseline: 61/62  01/10/22: 63 Goal status: ACHIEVED    2.  Patient will improve hip and knee strength by 1/2 MMT grade to help offload structures in lower spine for improved activity tolerance and decreased pain response. Baseline: Hip Ext R/L 3+/3+, Hip Add R/L 4/4, Hip Abd 4/4, Hip Flex R 4, Knee Flex R 4  01/10/22: Hip R/L Ext 4+/4+, Abd 4+/4+ Knee, Hip R/L Add 4/4  Ext 4+ 03/16/22: Hip Flex R/L 5/4+, Hip Abd R/L 4-/4, Hip Add L 3+ Goal status: ACHIEVED    3.  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  Baseline: Lumbar Extension 75% 01/10/22: Lumbar Ext 85% 02/01/22: Lumbar Ext 85% with pain  Goal status: PARTIALLY MET      PLAN: PT FREQUENCY: 1-2x/week   PT DURATION: 6 weeks   PLANNED INTERVENTIONS: Therapeutic exercises, Therapeutic activity, Neuromuscular re-education, Balance training, Gait training, Patient/Family education, Joint manipulation, Joint mobilization, Stair training, DME instructions, Aquatic Therapy, Dry Needling, Electrical stimulation, Spinal manipulation, Spinal mobilization, Cryotherapy, Moist heat, Traction, Manual therapy, and Re-evaluation.   PLAN FOR NEXT SESSION:   Re-certification. Progress hip and abdominal exercises     Bradly Chris PT, DPT  03/22/2022, 6:10 PM

## 2022-04-03 ENCOUNTER — Ambulatory Visit: Payer: Worker's Compensation | Admitting: Occupational Therapy

## 2022-04-10 ENCOUNTER — Encounter: Payer: Commercial Managed Care - HMO | Admitting: Occupational Therapy

## 2022-04-17 ENCOUNTER — Ambulatory Visit: Payer: Worker's Compensation | Admitting: Occupational Therapy

## 2022-04-24 ENCOUNTER — Ambulatory Visit: Payer: Worker's Compensation | Admitting: Physical Therapy

## 2022-04-26 ENCOUNTER — Ambulatory Visit: Payer: Worker's Compensation | Attending: Physician Assistant | Admitting: Physical Therapy

## 2022-04-26 ENCOUNTER — Encounter: Payer: Self-pay | Admitting: Physical Therapy

## 2022-04-26 DIAGNOSIS — M5459 Other low back pain: Secondary | ICD-10-CM | POA: Diagnosis present

## 2022-04-26 DIAGNOSIS — R262 Difficulty in walking, not elsewhere classified: Secondary | ICD-10-CM | POA: Diagnosis present

## 2022-04-26 NOTE — Addendum Note (Signed)
Addended by: Daneil Dan on: 04/26/2022 06:14 PM   Modules accepted: Orders

## 2022-04-26 NOTE — Therapy (Unsigned)
OUTPATIENT PHYSICAL THERAPY TREATMENT NOTE   Patient Name: Donna Santiago MRN: [id number] DOB:1985-08-22, 36 y.o., female Today's Date: 04/26/2022  PCP: Ellene Route  REFERRING PROVIDER: Ignacia Bayley PA-C   END OF SESSION:   PT End of Session - 04/26/22 1803     Visit Number 15    Number of Visits 24    Date for PT Re-Evaluation 05/30/22    Authorization Type Workers Comp    Authorization - Visit Number 15    Authorization - Number of Visits 24    Progress Note Due on Visit 15    PT Start Time 1800    PT Stop Time 1845    PT Time Calculation (min) 45 min    Equipment Utilized During Treatment Gait belt    Activity Tolerance Patient limited by pain    Behavior During Therapy WFL for tasks assessed/performed              Past Medical History:  Diagnosis Date   Diabetes mellitus without complication ([place])    Hypertension    Hypothyroidism    Migraines    Morbid obesity with body mass index (BMI) of 50.0 to 59.9 in adult [facility])    Past Surgical History:  Procedure Laterality Date   DILATION AND CURETTAGE OF UTERUS     ESOPHAGOGASTRODUODENOSCOPY (EGD) WITH PROPOFOL N/A 08/05/2021   Procedure: ESOPHAGOGASTRODUODENOSCOPY (EGD) WITH PROPOFOL;  Surgeon: Annamaria Helling, DO;  Location: [facility] ENDOSCOPY;  Service: Gastroenterology;  Laterality: N/A;  DM   There are no problems to display for this patient.   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 No record of incident from provider in chart because EMR not shared.   PRECAUTIONS: None   SUBJECTIVE: Pt reports that her low back feels increased pain during beginning of the session. She does not know what is causing it. Pt plans on seeing orthopedist for low back in the upcoming week.    PAIN:  Are you having pain? Yes: NPRS scale: 7/10 Pain location: Thoracic back on both sides  Pain description: Achy  Aggravating factors: Extension Relieving factors: Not sitting for long periods of time and extending back     OBJECTIVE:              VITALS: BP 130/87 HR 86 SpO2 100     DIAGNOSTIC FINDINGS:  MRI taken at [facility], pt reports no pinched nerves.    PATIENT SURVEYS:  FOTO 61/62 01/10/22: 63    SCREENING FOR RED FLAGS: Bowel or bladder incontinence: No Spinal tumors: No Cauda equina syndrome: No Compression fracture: No Abdominal aneurysm: No   COGNITION:           Overall cognitive status: Within functional limits for tasks assessed                          SENSATION: WFL Intermittent with pain spreading down left leg at night    MUSCLE LENGTH: Hamstrings: Right 60 deg; Left 60 deg Thomas test: Right NT deg; Left NT deg   POSTURE: rounded shoulders   PALPATION: Spinous Process of L1-L5    LUMBAR ROM:    Active  A/PROM  eval AROM  eval   Flexion 80% 90%  Extension 75%* 85%*  Right lateral flexion 100%  Left lateral flexion 100%   Right rotation 100%   Left rotation 100%    (Blank rows = not tested) *=Painful    LOWER EXTREMITY ROM:          Active  Right 10/24/2021 Left 10/24/2021  Hip flexion 120 120  Hip extension 20 20  Hip abduction 45 45  Hip adduction 30 30  Hip internal rotation 45 45  Hip external rotation 45 45  Knee flexion 135 135  Knee extension -5 -5  Ankle dorsiflexion 20 20  Ankle plantarflexion 50 50  Ankle inversion      Ankle eversion       (Blank rows = not tested)            LOWER EXTREMITY MMT:     MMT Right eval Left eval Right  01/10/22 Left  01/10/22 Right 03/15/22  Left  03/15/22  Hip flexion 5 4 5  4+ 5 4+   Hip extension 3+ 3+ 4+ 4+    Hip abduction 4 4 4+ 4+  4- 4  Hip adduction 4 4 4 4  3+   Hip internal rotation 5 5      Hip external rotation 5 5      Knee flexion 5 5      Knee  extension 5 4  4+    Ankle dorsiflexion 5 5      Ankle plantarflexion          Ankle inversion          Ankle eversion           (Blank rows = not tested)     LUMBAR SPECIAL TESTS:  Straight leg raise test: Negative   FUNCTIONAL TESTS:  None    GAIT: Distance walked: 25 ft  Assistive device utilized: None Level of assistance: Complete Independence Comments: Waddle like gait with wide base of support        TODAY'S TREATMENT   04/26/22 Nu-Step level 9 for arms and legs level 2 for resistance for 5 min Lower trunk rotations with 3 sec hold 3 x 10  Segmental Bridges 3 x 10 Seated ER stretch 3 x 30 sec   Seated Mini-Squat 3 x 10  -TC on 20 inch mat height  Seated HS stretches 6 x 30 sec    03/22/22  Nu-Step level 8 for arms and legs for 5 min Mini-Squat with BUE support 26 inch mat height 1 x 10  Mini-Squat with BUE support 25 inch mat height 1 x 10  Mini-Squat with BUE support 22 inch mat height 1 x 10  Standing Hip Abduction with yellow band 3 x 10  Seated Hip ER 6 x 30 sec  Seated HS stretch 6 x 30 sec    03/20/22 Nu-Step Level 8 for arms and legs for 5 min  OMEGA Palloff Press #10 3 x 10  OMEGA Seated Rows #35 1 x 10, #45 1 x 10, #40 1 x 10  OMEGA Chest Press #15 3 x 10   03/15/22 See MMT above  Supine Bridges 3 x 10  Lower Trunk Rotation 3 x 10  Supine Crunches with arms crossed 3 x 10  Seated Hip ER 3 x 30 sec  Seated Rows with Gray Band 3 x 10    PATIENT EDUCATION:  Education details: form and technique for appropriate exercise. Differentials for low back and hip pathologies and treatment.  Person educated: Patient Education method: Explanation, Demonstration, Verbal cues, and Handouts Education comprehension: verbalized  understanding, returned demonstration, and verbal cues required     HOME EXERCISE PROGRAM: Access Code: [email or web address] URL: [email or web address] Date: 03/15/2022 Prepared by: Bradly Chris  Exercises - Supine Lower  Trunk Rotation  - 1 x daily - 7 x weekly - 3 sets - 10 reps - Modified Thomas Stretch  - 1 x daily - 7 x weekly - 1 sets - 3 reps - 30 hold - Seated Hip External Rotation Stretch  - 1 x daily - 7 x weekly - 1 sets - 3 reps - 30 hold - Curl Up with Reach  - 1 x daily - 3 x weekly - 3 sets - 10 reps - Mini Squat  - 1 x daily - 3 x weekly - 3 sets - 10 reps - Standing Hip Abduction with Counter Support  - 1 x daily - 3 x weekly - 3 sets - 10 reps - Standing Shoulder Horizontal Abduction with Resistance  - 1 x daily - 3 x weekly - 3 sets - 10 reps - Seated Shoulder Row with Anchored Resistance  - 1 x daily - 3 x weekly - 3 sets - 10 reps  ASSESSMENT:   CLINICAL IMPRESSION: Pt continues to tolerate hip strengthening and stretching exercises without an increase in her pain. Pt requires intermittent min VC to avoid compensation especially for rotation with hip abduction anterior translation of knees with mini-squat. 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   OBJECTIVE IMPAIRMENTS Abnormal gait, decreased mobility, difficulty walking, decreased strength, impaired flexibility, obesity, and pain.    ACTIVITY LIMITATIONS carrying, lifting, standing, sleeping, dressing, reach over head, hygiene/grooming, and caring for others   PARTICIPATION LIMITATIONS: meal prep, cleaning, laundry, driving, community activity, occupation, and yard work   PERSONAL Education officer, community, Past/current experiences, Sex, Time since onset of injury/illness/exacerbation, and 1 comorbidity: Obesity  are also affecting patient's functional outcome.    REHAB POTENTIAL: Fair prior episode treatment for impairment    CLINICAL DECISION MAKING: Stable/uncomplicated   EVALUATION COMPLEXITY: Low     GOALS: Goals reviewed with patient?  No   SHORT TERM GOALS: Target date: 12/15/2021   Pt will be independent with HEP in order to improve strength and balance in order to decrease fall risk and improve function at home and work. Baseline: Able to perform independently  Goal status: ONGOING        LONG TERM GOALS: Target date: 01/26/2022   Patient will have improved function and activity level as evidenced by an increase in FOTO score by 10 points or more. Baseline: 61/62  01/10/22: 63 Goal status: ACHIEVED    2.  Patient will improve hip and knee strength by 1/2 MMT grade to help offload structures in lower spine for improved activity tolerance and decreased pain response. Baseline: Hip Ext R/L 3+/3+, Hip Add R/L 4/4, Hip Abd 4/4, Hip Flex R 4, Knee Flex R 4  01/10/22: Hip R/L Ext 4+/4+, Abd 4+/4+ Knee, Hip R/L Add 4/4  Ext 4+ 03/16/22: Hip Flex R/L 5/4+, Hip Abd R/L 4-/4, Hip Add L 3+ Goal status: ACHIEVED    3.  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  Baseline: Lumbar Extension 75% 01/10/22: Lumbar  Ext 85% 02/01/22: Lumbar Ext 85% with pain  Goal status: PARTIALLY MET      PLAN: PT FREQUENCY: 1-2x/week   PT DURATION: 6 weeks   PLANNED INTERVENTIONS: Therapeutic exercises, Therapeutic activity, Neuromuscular re-education, Balance training, Gait training, Patient/Family education, Joint manipulation, Joint mobilization, Stair training, DME instructions, Aquatic Therapy, Dry Needling, Electrical stimulation, Spinal manipulation, Spinal mobilization, Cryotherapy, Moist heat, Traction, Manual therapy, and Re-evaluation.   PLAN FOR NEXT SESSION:   Re-certification. Progress hip and abdominal exercises     Bradly Chris PT, DPT  04/26/2022, 6:33 PM

## 2022-05-01 ENCOUNTER — Ambulatory Visit: Payer: Worker's Compensation | Admitting: Physical Therapy

## 2022-05-03 ENCOUNTER — Ambulatory Visit: Payer: Worker's Compensation | Attending: Physician Assistant | Admitting: Physical Therapy

## 2022-05-03 ENCOUNTER — Encounter: Payer: Self-pay | Admitting: Physical Therapy

## 2022-05-03 DIAGNOSIS — M5459 Other low back pain: Secondary | ICD-10-CM | POA: Diagnosis not present

## 2022-05-03 DIAGNOSIS — R262 Difficulty in walking, not elsewhere classified: Secondary | ICD-10-CM | POA: Insufficient documentation

## 2022-05-03 NOTE — Therapy (Unsigned)
OUTPATIENT PHYSICAL THERAPY PROGRESS NOTE   Patient Name: Donna Santiago MRN: [id number] DOB:05/02/1986, 36 y.o., female Today's Date: 05/04/2022  PCP: Ellene Route  REFERRING PROVIDER: Ignacia Bayley PA-C   END OF SESSION:   PT End of Session - 05/03/22 1811     Visit Number 16    Number of Visits 24    Date for PT Re-Evaluation 05/30/22    Authorization Type Workers Comp    Authorization Time Period 03/31/22-05/30/22    Authorization - Visit Number 16    Authorization - Number of Visits 24    Progress Note Due on Visit 15    PT Start Time 1805    PT Stop Time 1835    PT Time Calculation (min) 30 min    Equipment Utilized During Treatment Gait belt    Activity Tolerance Patient limited by pain    Behavior During Therapy WFL for tasks assessed/performed              Past Medical History:  Diagnosis Date   Diabetes mellitus without complication ([place])    Hypertension    Hypothyroidism    Migraines    Morbid obesity with body mass index (BMI) of 50.0 to 59.9 in adult [facility])    Past Surgical History:  Procedure Laterality Date   DILATION AND CURETTAGE OF UTERUS     ESOPHAGOGASTRODUODENOSCOPY (EGD) WITH PROPOFOL N/A 08/05/2021   Procedure: ESOPHAGOGASTRODUODENOSCOPY (EGD) WITH PROPOFOL;  Surgeon: Annamaria Helling, DO;  Location: [facility] ENDOSCOPY;  Service: Gastroenterology;  Laterality: N/A;  DM   There are no problems to display for this patient.   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 No record of incident from provider in chart because EMR not shared.   PRECAUTIONS: None   SUBJECTIVE: Pt reports excruciating back pain and feels tired from her day at work. She is going to have a tendon release in her left hand and she will not be  able to continue with PT while she is recovering her left hand. She also reports that she will need to leave appointment early.   PAIN:  Are you having pain? Yes: NPRS scale: 7/10 Pain location: Thoracic back on both sides  Pain description: Achy  Aggravating factors: Extension Relieving factors: Not sitting for long periods of time and extending back     OBJECTIVE:              VITALS: BP 130/87 HR 86 SpO2 100     DIAGNOSTIC FINDINGS:  MRI taken at [facility], pt reports no pinched nerves.    PATIENT SURVEYS:  FOTO 61/62 01/10/22: 63    SCREENING FOR RED FLAGS: Bowel or bladder incontinence: No Spinal tumors: No Cauda equina syndrome: No Compression fracture: No Abdominal aneurysm: No   COGNITION:           Overall cognitive status: Within functional limits for tasks assessed                          SENSATION: WFL Intermittent with pain spreading down left leg at night    MUSCLE LENGTH: Hamstrings: Right 60 deg; Left 60 deg Thomas test: Right NT deg; Left NT deg   POSTURE: rounded shoulders   PALPATION: Spinous Process of L1-L5    LUMBAR ROM:  Active  A/PROM  eval AROM  eval   Flexion 80% 90%  Extension 75%* 85%*  Right lateral flexion 100%   Left lateral flexion 100%   Right rotation 100%   Left rotation 100%    (Blank rows = not tested) *=Painful    LOWER EXTREMITY ROM:          Active  Right 10/24/2021 Left 10/24/2021  Hip flexion 120 120  Hip extension 20 20  Hip abduction 45 45  Hip adduction 30 30  Hip internal rotation 45 45  Hip external rotation 45 45  Knee flexion 135 135  Knee extension -5 -5  Ankle dorsiflexion 20 20  Ankle plantarflexion 50 50  Ankle inversion      Ankle eversion       (Blank rows = not tested)            LOWER EXTREMITY MMT:     MMT Right eval Left eval Right  01/10/22 Left  01/10/22 Right 03/15/22  Left  03/15/22  Hip flexion _0 4+ 5 4+   Hip extension 3+ 3+ 4+ 4+    Hip abduction 4 4 4+ 4+   4- 4  Hip adduction _1 3+   Hip internal rotation 5 5      Hip external rotation 5 5      Knee flexion 5 5      Knee extension 5 4  4+    Ankle dorsiflexion 5 5      Ankle plantarflexion          Ankle inversion          Ankle eversion           (Blank rows = not tested)     LUMBAR SPECIAL TESTS:  Straight leg raise test: Negative   FUNCTIONAL TESTS:  None    GAIT: Distance walked: 25 ft  Assistive device utilized: None Level of assistance: Complete Independence Comments: Waddle like gait with wide base of support        TODAY'S TREATMENT   05/03/22 Nu-Step Seat and arms at 8 for 5 min  Supine Lower Trunk Rotations 1 x 5  Lumbar Flexion 95% Lumbar Extension 30% -Pt reports increased low back pain  Pelvic tilts 3 sec holds 1 x 10  Seated Marches 2 x 10  Seated Forward Flexion 3 x 30 sec   04/26/22 Nu-Step level 9 for arms and legs level 2 for resistance for 5 min Lower trunk rotations with 3 sec hold 3 x 10  Segmental Bridges 3 x 10 Seated ER stretch 3 x 30 sec   Seated Mini-Squat 3 x 10  -TC on 20 inch mat height  Seated HS stretches 6 x 30 sec    03/22/22  Nu-Step level 8 for arms and legs for 5 min Mini-Squat with BUE support 26 inch mat height 1 x 10  Mini-Squat with BUE support 25 inch mat height 1 x 10  Mini-Squat with BUE support 22 inch mat height 1 x 10  Standing Hip Abduction with yellow band 3 x 10  Seated Hip ER 6 x 30 sec  Seated HS stretch 6 x 30 sec    03/20/22 Nu-Step Level 8 for arms and legs for 5 min  OMEGA Palloff Press #10 3 x 10  OMEGA Seated Rows #35 1 x 10, #45 1 x 10, #40 1 x 10  OMEGA Chest Press #15 3 x 10   03/15/22  See MMT above  Supine Bridges 3 x 10  Lower Trunk Rotation 3 x 10  Supine Crunches with arms crossed 3 x 10  Seated Hip ER 3 x 30 sec  Seated Rows with Gray Band 3 x 10    PATIENT EDUCATION:  Education details: form and technique for appropriate exercise. Differentials for low back and hip pathologies  and treatment.  Person educated: Patient Education method: Explanation, Demonstration, Verbal cues, and Handouts Education comprehension: verbalized understanding, returned demonstration, and verbal cues required     HOME EXERCISE PROGRAM: Access Code: [email or web address] URL: [email or web address] Date: 03/15/2022 Prepared by: Bradly Chris  Exercises - Supine Lower Trunk Rotation  - 1 x daily - 7 x weekly - 3 sets - 10 reps - Modified Thomas Stretch  - 1 x daily - 7 x weekly - 1 sets - 3 reps - 30 hold - Seated Hip External Rotation Stretch  - 1 x daily - 7 x weekly - 1 sets - 3 reps - 30 hold - Curl Up with Reach  - 1 x daily - 3 x weekly - 3 sets - 10 reps - Mini Squat  - 1 x daily - 3 x weekly - 3 sets - 10 reps - Standing Hip Abduction with Counter Support  - 1 x daily - 3 x weekly - 3 sets - 10 reps - Standing Shoulder Horizontal Abduction with Resistance  - 1 x daily - 3 x weekly - 3 sets - 10 reps - Seated Shoulder Row with Anchored Resistance  - 1 x daily - 3 x weekly - 3 sets - 10 reps  ASSESSMENT:   CLINICAL IMPRESSION:  Pt limited by increased low back pain and unable to achieve supine position. All exercises modified to sitting exercises. She will be undergoing carpal tunnel surgery and she will need to stop therapy for the next couple of weeks until she knows more about schedule. 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   OBJECTIVE IMPAIRMENTS Abnormal gait, decreased mobility, difficulty walking, decreased strength, impaired flexibility, obesity, and pain.    ACTIVITY LIMITATIONS carrying, lifting, standing, sleeping, dressing, reach over head, hygiene/grooming, and caring for others   PARTICIPATION LIMITATIONS: meal prep, cleaning, laundry, driving, community  activity, occupation, and yard work   PERSONAL Education officer, community, Past/current experiences, Sex, Time since onset of injury/illness/exacerbation, and 1 comorbidity: Obesity  are also affecting patient's functional outcome.    REHAB POTENTIAL: Fair prior episode treatment for impairment    CLINICAL DECISION MAKING: Stable/uncomplicated   EVALUATION COMPLEXITY: Low     GOALS: Goals reviewed with patient? No   SHORT TERM GOALS: Target date: 05/03/2022   Pt will be independent with HEP in order to improve strength and balance in order to decrease fall risk and improve function at home and work. Baseline: Able to perform independently  Goal status: ACHIEVED        LONG TERM GOALS: Target date:05/30/2022   Patient will have improved function and activity level as evidenced by an increase in FOTO score by 10 points or more. Baseline: 61/62  01/10/22: 63 Goal status: ACHIEVED    2.  Patient will improve hip and knee strength by 1/2 MMT grade to help offload structures in lower spine for improved activity tolerance and decreased  pain response. Baseline: Hip Ext R/L 3+/3+, Hip Add R/L 4/4, Hip Abd 4/4, Hip Flex R 4, Knee Flex R 4  01/10/22: Hip R/L Ext 4+/4+, Abd 4+/4+ Knee, Hip R/L Add 4/4  Ext 4+ 03/16/22: Hip Flex R/L 5/4+, Hip Abd R/L 4-/4, Hip Add L 3+ Goal status: ACHIEVED    3.  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  Baseline: Lumbar Extension 75% 01/10/22: Lumbar Ext 85% 02/01/22: Lumbar Ext 85% with pain 05/03/22: 35%  Goal status: PARTIALLY MET      PLAN: PT FREQUENCY: 1-2x/week   PT DURATION: 6 weeks   PLANNED INTERVENTIONS: Therapeutic exercises, Therapeutic activity, Neuromuscular re-education, Balance training, Gait training, Patient/Family education, Joint manipulation, Joint mobilization, Stair training, DME instructions, Aquatic Therapy, Dry Needling, Electrical  stimulation, Spinal manipulation, Spinal mobilization, Cryotherapy, Moist heat, Traction, Manual therapy, and Re-evaluation.   PLAN FOR NEXT SESSION:    Reassess goals. Progress hip and abdominal exercises     Bradly Chris PT, DPT  05/04/2022, 9:21 AM

## 2022-11-23 ENCOUNTER — Other Ambulatory Visit: Payer: Self-pay

## 2022-11-23 DIAGNOSIS — R1011 Right upper quadrant pain: Secondary | ICD-10-CM

## 2022-11-23 DIAGNOSIS — R112 Nausea with vomiting, unspecified: Secondary | ICD-10-CM

## 2022-11-24 ENCOUNTER — Ambulatory Visit
Admission: RE | Admit: 2022-11-24 | Discharge: 2022-11-24 | Disposition: A | Discharge status: Home / Self Care | Payer: Commercial Managed Care - HMO | Source: Ambulatory Visit | Attending: Student | Admitting: Student

## 2022-11-24 DIAGNOSIS — R112 Nausea with vomiting, unspecified: Secondary | ICD-10-CM | POA: Insufficient documentation

## 2022-11-24 DIAGNOSIS — R1011 Right upper quadrant pain: Secondary | ICD-10-CM | POA: Insufficient documentation

## 2023-01-30 ENCOUNTER — Ambulatory Visit: Payer: Worker's Compensation | Attending: Physician Assistant | Admitting: Physical Therapy

## 2023-01-30 DIAGNOSIS — M5459 Other low back pain: Secondary | ICD-10-CM | POA: Diagnosis not present

## 2023-01-30 NOTE — Therapy (Signed)
OUTPATIENT PHYSICAL THERAPY THORACOLUMBAR EVALUATION   Patient Name: Donna Santiago MRN: [id number] DOB:[birth year], 37 y.o., female Today's Date: 01/30/2023  END OF SESSION:  PT End of Session - 01/30/23 1045     Visit Number 1    Number of Visits 20    Date for PT Re-Evaluation 04/10/23    Authorization Type Workers Comp    Authorization Time Period Not listed    Authorization - Visit Number 1    Authorization - Number of Visits 20    Progress Note Due on Visit 10    PT Start Time 0950    PT Stop Time 1030    PT Time Calculation (min) 40 min    Activity Tolerance Patient limited by pain    Behavior During Therapy WFL for tasks assessed/performed             Past Medical History:  Diagnosis Date   Diabetes mellitus without complication (HCC)    Hypertension    Hypothyroidism    Migraines    Morbid obesity with body mass index (BMI) of 50.0 to 59.9 in adult [facility])    Past Surgical History:  Procedure Laterality Date   DILATION AND CURETTAGE OF UTERUS     ESOPHAGOGASTRODUODENOSCOPY (EGD) WITH PROPOFOL N/A 08/05/2021   Procedure: ESOPHAGOGASTRODUODENOSCOPY (EGD) WITH PROPOFOL;  Surgeon: [name], DO;  Location: [facility] ENDOSCOPY;  Service: Gastroenterology;  Laterality: N/A;  DM   There are no problems to display for this patient.   PCP: Nira Retort   REFERRING PROVIDER: William Hamburger PA   REFERRING DIAG: Chronic low back pain   Rationale for Evaluation and Treatment: Rehabilitation  THERAPY DIAG:  Other low back pain  ONSET DATE: 2022   SUBJECTIVE:                                                                                                                                                                                           SUBJECTIVE STATEMENT: See pertinent history   PERTINENT HISTORY:  Pt continues to feel increased low back pain that has worsened since last PT plan of care. She reports feeling that pain has spread across a  larger area. 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 She mostly feels pain when laying down at night. She has seen physician who said that physical therapy is her best option at this pain.   PAIN:  Are you having pain? Yes: NPRS scale: 6-7/10 Pain location: L1-L5 Spinous process  Pain description: Achy, sharp and shooting  Aggravating factors: Night time is the worst  Relieving factors: Pain is intermittent but  physical therapy is only thing that helps   PRECAUTIONS: None  RED FLAGS: None   WEIGHT BEARING RESTRICTIONS: No  FALLS:  Has patient fallen in last 6 months? No  LIVING ENVIRONMENT: Lives with: lives with their family Lives in: House/apartment Stairs: Yes: External: 2 steps; none Has following equipment at home: None  OCCUPATION: Tourist information centre manager   PLOF: Independent  PATIENT GOALS: Reduce low back pain   NEXT MD VISIT: September 2024   OBJECTIVE:   VITAL BP 118/86 HR 80 SpO2 97   DIAGNOSTIC FINDINGS:  No diagnostic findings available to view in chart. Can find by contacting [facility]   PATIENT SURVEYS:  FOTO 61/100 with expected score of 61   SCREENING FOR RED FLAGS: Bowel or bladder incontinence: No Spinal tumors: No Cauda equina syndrome: No Compression fracture: No Abdominal aneurysm: No  COGNITION: Overall cognitive status: Within functional limits for tasks assessed     SENSATION: Light touch: Impaired   Lateral side of left side of low back down lateral side to lateral femoral condyle  MUSCLE LENGTH: Hamstrings: Right 70 deg; Left 70 deg Thomas test: Right NT deg; Left NT deg  POSTURE: rounded shoulders  PALPATION: T12-L5 Central spinous process TTP   LUMBAR ROM:   AROM eval  Flexion 90%  Extension 50%*  Right lateral flexion 75%*  Left lateral flexion 75%*  Right rotation 100%  Left rotation 100%   (Blank rows = not tested)  LOWER EXTREMITY ROM:     Active  Right eval  Left eval  Hip flexion    Hip extension    Hip abduction    Hip adduction    Hip internal rotation    Hip external rotation    Knee flexion    Knee extension    Ankle dorsiflexion    Ankle plantarflexion    Ankle inversion    Ankle eversion     (Blank rows = not tested)  LOWER EXTREMITY MMT:    MMT Right eval Left eval  Hip flexion 4 4  Hip extension 4- 4-  Hip abduction 4 4  Hip adduction    Hip internal rotation    Hip external rotation    Knee flexion 4+ 4+  Knee extension 4+ 4+  Ankle dorsiflexion 4+ 4+  Ankle plantarflexion    Ankle inversion    Ankle eversion     (Blank rows = not tested)  LUMBAR SPECIAL TESTS:  Straight leg raise test: Negative, FABER test: Negative, and FADIR Negative   FUNCTIONAL TESTS:  Squat: NT   GAIT: Distance walked: 50 ft  Assistive device utilized: None Level of assistance: Complete Independence Comments: No gait deficits noted   TODAY'S TREATMENT:                                                                                                                              DATE:   01/30/23   Lower Trunk Rotation 3 x 10  with 3 sec hold  Supine Bridges 3 x 10     PATIENT EDUCATION:  Education details: form and technique for correct performance of exercises  Person educated: Patient Education method: Explanation, Demonstration, Verbal cues, and Handouts Education comprehension: verbalized understanding, returned demonstration, and verbal cues required  HOME EXERCISE PROGRAM: Access Code: [email or web address] URL: [email or web address] Date: 01/30/2023 Prepared by: Ellin Goodie  Exercises - Supine Lower Trunk Rotation  - 1 x daily - 2 sets - 10 reps - 3 sec hold  hold - Supine Bridge  - 3-4 x weekly - 3 sets - 10 reps  ASSESSMENT:  CLINICAL IMPRESSION: Patient is a 37 y.o. white woman who was seen today for physical therapy evaluation and treatment for chronic low back pain. She presents with signs and symptoms  that include increased lumbar and thoracic back pain along with radiating left sided low back pain that is high to moderate, a stable symptom status, and that is moderately disabling that makes her most appropriate for the movement control treatment group. She also shows deficits that include decreased hip strength and lumbar mobility along with increased low back pain that are limiting her ability to stand and lay down without increased pain. 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     OBJECTIVE IMPAIRMENTS: decreased mobility, difficulty walking, decreased strength, hypomobility, obesity, and pain.   ACTIVITY LIMITATIONS: carrying, lifting, bending, standing, squatting, sleeping, locomotion level, and caring for others  PARTICIPATION LIMITATIONS: shopping and occupation  PERSONAL FACTORS: Age, Fitness, Past/current experiences, and Time since onset of injury/illness/exacerbation are also affecting patient's functional outcome.   REHAB POTENTIAL: Fair chronicity of low back pain and multiple bouts of PT   CLINICAL DECISION MAKING: Stable/uncomplicated  EVALUATION COMPLEXITY: Low   GOALS: Goals reviewed with patient? No  SHORT TERM GOALS: Target date: 02/13/2023  Pt will be independent with HEP in order to improve strength and balance in order to decrease fall risk and improve function at home and work. Baseline: NT  Goal status: INITIAL    LONG TERM GOALS: Target date: 04/10/2023  Patient will have improved function and activity level as evidenced by an increase in FOTO score by 10 points or more.  Baseline: 61 with target of 61  Goal status: Deferred   2.  Patient will improve hip strength by >=1/3 grade MMT (ie 4- to 4) and abdominal strength by >=1 level for improved lumbar function and symptom relief.  Baseline: Hip Flex R/L  4/4, Hip Ext R/L 4-/4-, Hip Abd R/L 4/4 , Sahrman Level NT  Goal status: INITIAL  3.  Patient will decrease low back pain to <=5/10 NRPS while performing standing and bending activities required for job as a Runner, broadcasting/film/video and for lifting and carrying tasks required for caring for children.  Baseline: 8/10 NRPS  Goal status: INITIAL    PLAN:  PT FREQUENCY: 1-2x/week  PT DURATION: 10 weeks  PLANNED INTERVENTIONS: Therapeutic exercises, Neuromuscular re-education, Balance training, Gait training, Patient/Family education, Self Care, Joint mobilization, Joint manipulation, Stair training, DME instructions, Aquatic Therapy, Dry Needling, Electrical stimulation, Spinal manipulation, Spinal mobilization, Cryotherapy, Moist heat, Manual therapy, and Re-evaluation.  PLAN FOR NEXT SESSION: Sahrman assessment, progress lumbar and thoracic mobility exercises and progress hip strengthening exercises. Estim for pain control.    Ellin Goodie PT, DPT  [facility] [address]  [address] [place], [place], [address] Phone: [phone]   Fax:  [phone]

## 2023-02-01 ENCOUNTER — Ambulatory Visit: Payer: Worker's Compensation | Admitting: Physical Therapy

## 2023-02-05 ENCOUNTER — Ambulatory Visit: Payer: Worker's Compensation | Admitting: Physical Therapy

## 2023-02-07 ENCOUNTER — Ambulatory Visit: Payer: Worker's Compensation | Admitting: Physical Therapy

## 2023-02-07 ENCOUNTER — Telehealth: Payer: Self-pay | Admitting: Physical Therapy

## 2023-02-07 NOTE — Telephone Encounter (Signed)
Called pt to confirm about repeated absences. Pt reports that she is only able to make later times and she communicated this to office. PT has taken pt off for all apts scheduled before 5:30 and will look out for any available later slots.

## 2023-02-12 ENCOUNTER — Ambulatory Visit: Payer: Worker's Compensation | Admitting: Physical Therapy

## 2023-02-14 ENCOUNTER — Encounter: Payer: Self-pay | Admitting: Physical Therapy

## 2023-02-19 ENCOUNTER — Encounter: Payer: Self-pay | Admitting: Physical Therapy

## 2023-02-20 ENCOUNTER — Encounter: Payer: Self-pay | Admitting: Physical Therapy

## 2023-02-21 ENCOUNTER — Encounter: Payer: Self-pay | Admitting: Physical Therapy

## 2023-02-26 ENCOUNTER — Encounter: Payer: Self-pay | Admitting: Physical Therapy

## 2023-02-27 ENCOUNTER — Ambulatory Visit: Payer: Worker's Compensation | Attending: Physician Assistant | Admitting: Physical Therapy

## 2023-02-27 DIAGNOSIS — M5459 Other low back pain: Secondary | ICD-10-CM | POA: Insufficient documentation

## 2023-02-27 DIAGNOSIS — R262 Difficulty in walking, not elsewhere classified: Secondary | ICD-10-CM | POA: Insufficient documentation

## 2023-02-27 NOTE — Therapy (Signed)
OUTPATIENT PHYSICAL THERAPY THORACOLUMBAR TREATMENT      Patient Name: Donna Santiago MRN: [id number] DOB:May 12, 1986, 37 y.o., female Today's Date: 01/30/2023   END OF SESSION:   PT End of Session - 02/27/23 1736     Visit Number 2    Number of Visits 20    Date for PT Re-Evaluation 04/10/23    Authorization Type Workers Comp    Authorization Time Period Not listed    Authorization - Visit Number 2    Authorization - Number of Visits 20    Progress Note Due on Visit 10    PT Start Time 1735    PT Stop Time 1815    PT Time Calculation (min) 40 min    Activity Tolerance Patient limited by pain    Behavior During Therapy WFL for tasks assessed/performed                          Past Medical History:  Diagnosis Date   Diabetes mellitus without complication (HCC)     Hypertension     Hypothyroidism     Migraines     Morbid obesity with body mass index (BMI) of 50.0 to 59.9 in adult [facility])               Past Surgical History:  Procedure Laterality Date   DILATION AND CURETTAGE OF UTERUS       ESOPHAGOGASTRODUODENOSCOPY (EGD) WITH PROPOFOL N/A 08/05/2021    Procedure: ESOPHAGOGASTRODUODENOSCOPY (EGD) WITH PROPOFOL;  Surgeon: [name], DO;  Location: [facility] ENDOSCOPY;  Service: Gastroenterology;  Laterality: N/A;  DM        There are no problems to display for this patient.     PCP: Nira Retort    REFERRING PROVIDER: William Hamburger PA    REFERRING DIAG: Chronic low back pain    Rationale for Evaluation and Treatment: Rehabilitation   THERAPY DIAG:  Other low back pain   ONSET DATE: 2022    SUBJECTIVE:                                                                                                                                                                                            SUBJECTIVE STATEMENT: Pt is returning after nearly a month to PT because of scheduling conflicts. She continues to have low back pain that has not  improved. She has seen physicians who have told her that surgery is not an option.    PERTINENT HISTORY:  Pt continues to feel increased low back pain that has worsened since last PT plan of care. She reports feeling  that pain has spread across a larger area. 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 She mostly feels pain when laying down at night. She has seen physician who said that physical therapy is her best option at this pain.    PAIN:  Are you having pain? Yes: NPRS scale: 6-7/10 Pain location: L1-L5 Spinous process  Pain description: Achy, sharp and shooting  Aggravating factors: Night time is the worst  Relieving factors: Pain is intermittent but physical therapy is only thing that helps    PRECAUTIONS: None   RED FLAGS: None      WEIGHT BEARING RESTRICTIONS: No   FALLS:  Has patient fallen in last 6 months? No   LIVING ENVIRONMENT: Lives with: lives with their family Lives in: House/apartment Stairs: Yes: External: 2 steps; none Has following equipment at home: None   OCCUPATION: Tourist information centre manager    PLOF: Independent   PATIENT GOALS: Reduce low back pain    NEXT MD VISIT: September 2024    OBJECTIVE:    VITAL BP 118/86 HR 80 SpO2 97    DIAGNOSTIC FINDINGS:  No diagnostic findings available to view in chart. Can find by contacting [facility]    PATIENT SURVEYS:  FOTO 61/100 with expected score of 61    SCREENING FOR RED FLAGS: Bowel or bladder incontinence: No Spinal tumors: No Cauda equina syndrome: No Compression fracture: No Abdominal aneurysm: No   COGNITION: Overall cognitive status: Within functional limits for tasks assessed                          SENSATION: Light touch: Impaired   Lateral side of left side of low back down lateral side to lateral femoral condyle   MUSCLE LENGTH: Hamstrings: Right 70 deg; Left 70 deg Thomas test: Right NT deg; Left NT deg   POSTURE: rounded shoulders    PALPATION: T12-L5 Central spinous process TTP    LUMBAR ROM:    AROM eval  Flexion 90%  Extension 50%*  Right lateral flexion 75%*  Left lateral flexion 75%*  Right rotation 100%  Left rotation 100%   (Blank rows = not tested)   LOWER EXTREMITY ROM:      Active  Right eval Left eval  Hip flexion      Hip extension      Hip abduction      Hip adduction      Hip internal rotation      Hip external rotation      Knee flexion      Knee extension      Ankle dorsiflexion      Ankle plantarflexion      Ankle inversion      Ankle eversion       (Blank rows = not tested)   LOWER EXTREMITY MMT:     MMT Right eval Left eval  Hip flexion 4 4  Hip extension 4- 4-  Hip abduction 4 4  Hip adduction      Hip internal rotation      Hip external rotation      Knee flexion 4+ 4+  Knee extension 4+ 4+  Ankle dorsiflexion 4+ 4+  Ankle plantarflexion      Ankle inversion      Ankle eversion       (Blank rows = not tested)   LUMBAR SPECIAL TESTS:  Straight leg raise test: Negative, FABER test: Negative, and FADIR  Negative    FUNCTIONAL TESTS:  Squat: NT    GAIT: Distance walked: 50 ft  Assistive device utilized: None Level of assistance: Complete Independence Comments: No gait deficits noted    TODAY'S TREATMENT:                                                                                                                              DATE:    02/27/23: Nu-Step seat and arms at 10 for 5 min  Supine 90/90 alternating heel touches 2 x 5  Supine Marches 3 x 10 Supine Bridges 3 x 10 Straight Leg Raise 3 x 10   Supine posterior pelvic tilt 3 sec 1 x 10  Supine Bent Knee Fallouts Blue TB 3 x 10    01/30/23   Lower Trunk Rotation 3 x 10 with 3 sec hold  Supine Bridges 3 x 10        PATIENT EDUCATION:  Education details: form and technique for correct performance of exercises  Person educated: Patient Education method: Programmer, multimedia, Demonstration, Verbal cues,  and Handouts Education comprehension: verbalized understanding, returned demonstration, and verbal cues required   HOME EXERCISE PROGRAM: Access Code: [email or web address] URL: [email or web address] Date: 02/27/2023 Prepared by: Ellin Goodie  Exercises - Supine Lower Trunk Rotation  - 1 x daily - 2 sets - 10 reps - 3 sec hold  hold - Supine Bridge  - 3-4 x weekly - 3 sets - 10 reps - Supine Active Straight Leg Raise  - 3-4 x weekly - 3 sets - 10 reps - Bent Knee Fallouts  - 3-4 x weekly - 3 sets - 10 reps   ASSESSMENT:   CLINICAL IMPRESSION:  Despite increased pain at the start of the session, pt able to complete all hip strengthening. Exercises modified to include supine and hooklying positions to avoid pain exacerbation. Pt continues to have scheduling difficulties so HEP adherence stressed to make progress. 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     OBJECTIVE IMPAIRMENTS: decreased mobility, difficulty walking, decreased strength, hypomobility, obesity, and pain.    ACTIVITY LIMITATIONS: carrying, lifting, bending, standing, squatting, sleeping, locomotion level, and caring for others   PARTICIPATION LIMITATIONS: shopping and occupation   PERSONAL FACTORS: Age, Fitness, Past/current experiences, and Time since onset of injury/illness/exacerbation are also affecting patient's functional outcome.    REHAB POTENTIAL: Fair chronicity of low back pain and multiple bouts of PT    CLINICAL DECISION MAKING: Stable/uncomplicated   EVALUATION COMPLEXITY: Low     GOALS: Goals reviewed with patient? No   SHORT TERM GOALS: Target date: 02/13/2023   Pt will be independent with HEP in order to improve strength and balance in order to decrease fall risk and improve function at home and work. Baseline: NT  Goal status: ONGOING  LONG TERM GOALS: Target date: 04/10/2023   Patient will have improved function and activity level as evidenced by an increase in FOTO score by 10 points or more.  Baseline: 61 with target of 61  Goal status: Deferred    2.  Patient will improve hip strength by >=1/3 grade MMT (ie 4- to 4) and abdominal strength by >=1 level for improved lumbar function and symptom relief.  Baseline: Hip Flex R/L 4/4, Hip Ext R/L 4-/4-, Hip Abd R/L 4/4 , Sahrman Level NT  Goal status: ONGOING    3.  Patient will decrease low back pain to <=5/10 NRPS while performing standing and bending activities required for job as a Runner, broadcasting/film/video and for lifting and carrying tasks required for caring for children.  Baseline: 8/10 NRPS  Goal status: ONGOING        PLAN:   PT FREQUENCY: 1-2x/week   PT DURATION: 10 weeks   PLANNED INTERVENTIONS: Therapeutic exercises, Neuromuscular re-education, Balance training, Gait training, Patient/Family education, Self Care, Joint mobilization, Joint manipulation, Stair training, DME instructions, Aquatic Therapy, Dry Needling, Electrical stimulation, Spinal manipulation, Spinal mobilization, Cryotherapy, Moist heat, Manual therapy, and Re-evaluation.   PLAN FOR NEXT SESSION: Continue to progress hooklying positions to pt's pain tolerance. Estim for pain control.

## 2023-02-28 ENCOUNTER — Encounter: Payer: Self-pay | Admitting: Physical Therapy

## 2023-03-06 ENCOUNTER — Encounter: Payer: Self-pay | Admitting: Physical Therapy

## 2023-03-08 ENCOUNTER — Encounter: Payer: Self-pay | Admitting: Physical Therapy

## 2023-03-12 ENCOUNTER — Ambulatory Visit: Payer: Worker's Compensation | Admitting: Physical Therapy

## 2023-03-13 ENCOUNTER — Encounter: Payer: Self-pay | Admitting: Physical Therapy

## 2023-03-14 ENCOUNTER — Encounter: Payer: Self-pay | Admitting: Physical Therapy

## 2023-03-15 ENCOUNTER — Encounter: Payer: Self-pay | Admitting: Physical Therapy

## 2023-03-27 ENCOUNTER — Ambulatory Visit: Payer: Worker's Compensation | Attending: Physician Assistant | Admitting: Physical Therapy

## 2023-04-04 ENCOUNTER — Ambulatory Visit: Payer: Worker's Compensation | Admitting: Physical Therapy

## 2023-04-04 ENCOUNTER — Telehealth: Payer: Self-pay | Admitting: Physical Therapy

## 2023-04-04 NOTE — Telephone Encounter (Signed)
Called pt to inquire about whether she could make upcoming appointment. Pt said Wednesdays do not work for her because of her job. PT to cancel all Wednesday appointments and reminded pt of upcoming Monday apt. PT instructed pt to check in with scheduling to reschedule Wednesday appointments.

## 2023-04-09 ENCOUNTER — Ambulatory Visit: Payer: Worker's Compensation | Attending: Physician Assistant | Admitting: Physical Therapy

## 2023-04-09 DIAGNOSIS — R262 Difficulty in walking, not elsewhere classified: Secondary | ICD-10-CM | POA: Insufficient documentation

## 2023-04-09 DIAGNOSIS — M6281 Muscle weakness (generalized): Secondary | ICD-10-CM | POA: Diagnosis present

## 2023-04-09 DIAGNOSIS — M5459 Other low back pain: Secondary | ICD-10-CM | POA: Insufficient documentation

## 2023-04-09 NOTE — Therapy (Unsigned)
OUTPATIENT PHYSICAL THERAPY THORACOLUMBAR RE-EVALUATION   Dates of Reporting:  01/30/23-04/09/23  Patient Name: Donna Santiago MRN: [id number] DOB:1986/05/17, 37 y.o., female Today's Date: 01/30/2023   END OF SESSION:   PT End of Session - 04/09/23 1817     Visit Number 3    Number of Visits 20    Date for PT Re-Evaluation 04/10/23    Authorization Type Workers Comp    Authorization Time Period Not listed    Authorization - Visit Number 3    Authorization - Number of Visits 20    Progress Note Due on Visit 10    PT Start Time 1815    PT Stop Time 1900    PT Time Calculation (min) 45 min    Activity Tolerance Patient limited by pain    Behavior During Therapy WFL for tasks assessed/performed                          Past Medical History:  Diagnosis Date   Diabetes mellitus without complication (HCC)     Hypertension     Hypothyroidism     Migraines     Morbid obesity with body mass index (BMI) of 50.0 to 59.9 in adult [facility])               Past Surgical History:  Procedure Laterality Date   DILATION AND CURETTAGE OF UTERUS       ESOPHAGOGASTRODUODENOSCOPY (EGD) WITH PROPOFOL N/A 08/05/2021    Procedure: ESOPHAGOGASTRODUODENOSCOPY (EGD) WITH PROPOFOL;  Surgeon: [name], DO;  Location: [facility] ENDOSCOPY;  Service: Gastroenterology;  Laterality: N/A;  DM        There are no problems to display for this patient.     PCP: Nira Retort    REFERRING PROVIDER: William Hamburger PA    REFERRING DIAG: Chronic low back pain    Rationale for Evaluation and Treatment: Rehabilitation   THERAPY DIAG:  Other low back pain   ONSET DATE: 2022    SUBJECTIVE:                                                                                                                                                                                            SUBJECTIVE STATEMENT: Pt states that she is now able to consistently make appointments given that they are  scheduled at right time. She continues to feel low back pain.    PERTINENT HISTORY:  Pt continues to feel increased low back pain that has worsened since last PT plan of care. She reports feeling that pain has spread across a larger area. 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 She mostly feels pain when laying down at night. She has seen physician who said that physical therapy is her best option at this pain.    PAIN:  Are you having pain? Yes: NPRS scale: 5/10 Pain location: L1-L5 Spinous process  Pain description: Achy, sharp and shooting  Aggravating factors: Night time is the worst  Relieving factors: Pain is intermittent but physical therapy is only thing that helps    PRECAUTIONS: None   RED FLAGS: None      WEIGHT BEARING RESTRICTIONS: No   FALLS:  Has patient fallen in last 6 months? No   LIVING ENVIRONMENT: Lives with: lives with their family Lives in: House/apartment Stairs: Yes: External: 2 steps; none Has following equipment at home: None   OCCUPATION: Tourist information centre manager    PLOF: Independent   PATIENT GOALS: Reduce low back pain    NEXT MD VISIT: September 2024    OBJECTIVE:    VITAL BP 118/86 HR 80 SpO2 97    DIAGNOSTIC FINDINGS:  No diagnostic findings available to view in chart. Can find by contacting [facility]    PATIENT SURVEYS:  FOTO 61/100 with expected score of 61    SCREENING FOR RED FLAGS: Bowel or bladder incontinence: No Spinal tumors: No Cauda equina syndrome: No Compression fracture: No Abdominal aneurysm: No   COGNITION: Overall cognitive status: Within functional limits for tasks assessed                          SENSATION: Light touch: Impaired   Lateral side of left side of low back down lateral side to lateral femoral condyle   MUSCLE LENGTH: Hamstrings: Right 70 deg; Left 70 deg Thomas test: Right NT deg; Left NT deg   POSTURE: rounded shoulders   PALPATION: T12-L5  Central spinous process TTP    LUMBAR ROM:    AROM eval  Flexion 90%  Extension 50%*  Right lateral flexion 75%*  Left lateral flexion 75%*  Right rotation 100%  Left rotation 100%   (Blank rows = not tested)   LOWER EXTREMITY ROM:      Active  Right eval Left eval  Hip flexion      Hip extension      Hip abduction      Hip adduction      Hip internal rotation      Hip external rotation      Knee flexion      Knee extension      Ankle dorsiflexion      Ankle plantarflexion      Ankle inversion      Ankle eversion       (Blank rows = not tested)   LOWER EXTREMITY MMT:     MMT Right eval Left eval  Hip flexion 4 4  Hip extension 4- 4-  Hip abduction 4 4  Hip adduction      Hip internal rotation      Hip external rotation      Knee flexion 4+ 4+  Knee extension 4+ 4+  Ankle dorsiflexion 4+ 4+  Ankle plantarflexion      Ankle inversion      Ankle eversion       (Blank rows = not tested)   LUMBAR SPECIAL TESTS:  Straight leg raise test: Negative, FABER test: Negative, and FADIR Negative    FUNCTIONAL TESTS:  Squat: NT  GAIT: Distance walked: 50 ft  Assistive device utilized: None Level of assistance: Complete Independence Comments: No gait deficits noted    TODAY'S TREATMENT:                                                                                                                              DATE:    04/09/23:  Hip MMT: -Flex R/L 4/4 -Ext R/L  4/4 -Abd R/L 4/4-   Supine Bridges 1 x 10  Supine Bridges with adduction 2 x 10  FOTO: 57/100 with target of 61  Supine Bridges with hip abduction using yellow band 2 x 10   02/27/23: Nu-Step seat and arms at 10 for 5 min  Supine 90/90 alternating heel touches 2 x 5  Supine Marches 3 x 10 Supine Bridges 3 x 10 Straight Leg Raise 3 x 10   Supine posterior pelvic tilt 3 sec 1 x 10  Supine Bent Knee Fallouts Blue TB 3 x 10    01/30/23   Lower Trunk Rotation 3 x 10 with 3 sec hold  Supine  Bridges 3 x 10       PATIENT EDUCATION:  Education details: form and technique for correct performance of exercises  Person educated: Patient Education method: Programmer, multimedia, Demonstration, Verbal cues, and Handouts Education comprehension: verbalized understanding, returned demonstration, and verbal cues required   HOME EXERCISE PROGRAM: Access Code: [email or web address] URL: [email or web address] Date: 02/27/2023 Prepared by: Ellin Goodie  Exercises - Supine Lower Trunk Rotation  - 1 x daily - 2 sets - 10 reps - 3 sec hold  hold - Supine Bridge  - 3-4 x weekly - 3 sets - 10 reps - Supine Active Straight Leg Raise  - 3-4 x weekly - 3 sets - 10 reps - Bent Knee Fallouts  - 3-4 x weekly - 3 sets - 10 reps   ASSESSMENT:   CLINICAL IMPRESSION: Pt returning after a prolong hiatus due to scheduling difficulties. She continues to have hip strength    Despite increased pain at the start of the session, pt able to complete all hip strengthening. Exercises modified to include supine and hooklying positions to avoid pain exacerbation. Pt continues to have scheduling difficulties so HEP adherence stressed to make progress. 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     OBJECTIVE IMPAIRMENTS: decreased mobility, difficulty walking, decreased strength, hypomobility, obesity, and pain.    ACTIVITY LIMITATIONS: carrying, lifting, bending, standing, squatting, sleeping, locomotion level, and caring for others   PARTICIPATION LIMITATIONS: shopping and occupation   PERSONAL FACTORS: Age, Fitness, Past/current experiences, and Time since onset of injury/illness/exacerbation are also affecting patient's functional outcome.    REHAB POTENTIAL: Fair chronicity of low back pain and multiple bouts of PT    CLINICAL DECISION MAKING:  Stable/uncomplicated   EVALUATION COMPLEXITY: Low  GOALS: Goals reviewed with patient? No   SHORT TERM GOALS: Target date: 02/13/2023   Pt will be independent with HEP in order to improve strength and balance in order to decrease fall risk and improve function at home and work. Baseline: NT 04/09/23: Able to perform exercises  Goal status: PARTIALLY MET        LONG TERM GOALS: Target date: 04/10/2023   Patient will have improved function and activity level as evidenced by an increase in FOTO score by 10 points or more.  Baseline: 61 with target of 61  Goal status: Deferred    2.  Patient will improve hip strength by >=1/3 grade MMT (ie 4- to 4) and abdominal strength by >=1 level for improved lumbar function and symptom relief.  Baseline: Hip Flex R/L 4/4, Hip Ext R/L 4-/4-, Hip Abd R/L 4/4 , Sahrman Level Defer Goal status: ONGOING    3.  Patient will decrease low back pain to <=5/10 NRPS while performing standing and bending activities required for job as a Runner, broadcasting/film/video and for lifting and carrying tasks required for caring for children.  Baseline: 8/10 NRPS  04/09/23: 8/10 NRPS  Goal status: ONGOING        PLAN:   PT FREQUENCY: 1-2x/week   PT DURATION: 10 weeks   PLANNED INTERVENTIONS: Therapeutic exercises, Neuromuscular re-education, Balance training, Gait training, Patient/Family education, Self Care, Joint mobilization, Joint manipulation, Stair training, DME instructions, Aquatic Therapy, Dry Needling, Electrical stimulation, Spinal manipulation, Spinal mobilization, Cryotherapy, Moist heat, Manual therapy, and Re-evaluation.   PLAN FOR NEXT SESSION: Continue to progress hooklying positions to pt's pain tolerance. Estim for pain control.

## 2023-04-11 ENCOUNTER — Ambulatory Visit: Payer: Worker's Compensation | Admitting: Physical Therapy

## 2023-04-18 ENCOUNTER — Encounter: Payer: Self-pay | Admitting: Physical Therapy

## 2023-04-23 ENCOUNTER — Ambulatory Visit: Payer: Worker's Compensation | Admitting: Physical Therapy

## 2023-04-25 ENCOUNTER — Encounter: Payer: Self-pay | Admitting: Physical Therapy

## 2023-04-30 ENCOUNTER — Ambulatory Visit: Payer: Worker's Compensation

## 2023-04-30 ENCOUNTER — Encounter: Payer: Self-pay | Admitting: Physical Therapy

## 2023-04-30 DIAGNOSIS — R262 Difficulty in walking, not elsewhere classified: Secondary | ICD-10-CM

## 2023-04-30 DIAGNOSIS — M5459 Other low back pain: Secondary | ICD-10-CM

## 2023-04-30 DIAGNOSIS — M6281 Muscle weakness (generalized): Secondary | ICD-10-CM

## 2023-04-30 NOTE — Therapy (Addendum)
OUTPATIENT PHYSICAL THERAPY THORACOLUMBAR TREATMENT   Patient Name: Donna Santiago MRN: [id number] DOB:12-01-1985, 37 y.o., female Today's Date: 01/30/2023   END OF SESSION:   PT End of Session - 04/30/23 1730     Visit Number 4    Number of Visits 20    Date for PT Re-Evaluation 06/10/23    Authorization Type Workers Comp    Authorization Time Period Not listed    Authorization - Number of Visits 20    Progress Note Due on Visit 10    PT Start Time 1730    PT Stop Time 1800    PT Time Calculation (min) 30 min    Activity Tolerance Patient limited by pain    Behavior During Therapy WFL for tasks assessed/performed                           Past Medical History:  Diagnosis Date   Diabetes mellitus without complication (HCC)     Hypertension     Hypothyroidism     Migraines     Morbid obesity with body mass index (BMI) of 50.0 to 59.9 in adult [facility])               Past Surgical History:  Procedure Laterality Date   DILATION AND CURETTAGE OF UTERUS       ESOPHAGOGASTRODUODENOSCOPY (EGD) WITH PROPOFOL N/A 08/05/2021    Procedure: ESOPHAGOGASTRODUODENOSCOPY (EGD) WITH PROPOFOL;  Surgeon: [name], DO;  Location: [facility] ENDOSCOPY;  Service: Gastroenterology;  Laterality: N/A;  DM        There are no problems to display for this patient.     PCP: Nira Retort    REFERRING PROVIDER: William Hamburger PA    REFERRING DIAG: Chronic low back pain    Rationale for Evaluation and Treatment: Rehabilitation   THERAPY DIAG:  Other low back pain   ONSET DATE: 2022    SUBJECTIVE:                                                                                                                                                                                            SUBJECTIVE STATEMENT: Patient states no pain on arrival. Has been completing her exercise 2-3x/week.    PERTINENT HISTORY:  Pt continues to feel increased low back pain that has  worsened since last PT plan of care. She reports feeling that pain has spread across a larger area. 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 She mostly feels pain when laying down at night. She has seen physician who  said that physical therapy is her best option at this pain.    PAIN:  Are you having pain? Yes: NPRS scale: 5/10 Pain location: L1-L5 Spinous process  Pain description: Achy, sharp and shooting  Aggravating factors: Night time is the worst  Relieving factors: Pain is intermittent but physical therapy is only thing that helps    PRECAUTIONS: None   RED FLAGS: None      WEIGHT BEARING RESTRICTIONS: No   FALLS:  Has patient fallen in last 6 months? No   LIVING ENVIRONMENT: Lives with: lives with their family Lives in: House/apartment Stairs: Yes: External: 2 steps; none Has following equipment at home: None   OCCUPATION: Tourist information centre manager    PLOF: Independent   PATIENT GOALS: Reduce low back pain    NEXT MD VISIT: September 2024    OBJECTIVE:    VITAL BP 118/86 HR 80 SpO2 97    DIAGNOSTIC FINDINGS:  No diagnostic findings available to view in chart. Can find by contacting [facility]    PATIENT SURVEYS:  FOTO 61/100 with expected score of 61    SCREENING FOR RED FLAGS: Bowel or bladder incontinence: No Spinal tumors: No Cauda equina syndrome: No Compression fracture: No Abdominal aneurysm: No   COGNITION: Overall cognitive status: Within functional limits for tasks assessed                          SENSATION: Light touch: Impaired   Lateral side of left side of low back down lateral side to lateral femoral condyle   MUSCLE LENGTH: Hamstrings: Right 70 deg; Left 70 deg Thomas test: Right NT deg; Left NT deg   POSTURE: rounded shoulders   PALPATION: T12-L5 Central spinous process TTP    LUMBAR ROM:    AROM eval  Flexion 90%  Extension 50%*  Right lateral flexion 75%*  Left lateral flexion 75%*   Right rotation 100%  Left rotation 100%   (Blank rows = not tested)   LOWER EXTREMITY ROM:      Active  Right eval Left eval  Hip flexion      Hip extension      Hip abduction      Hip adduction      Hip internal rotation      Hip external rotation      Knee flexion      Knee extension      Ankle dorsiflexion      Ankle plantarflexion      Ankle inversion      Ankle eversion       (Blank rows = not tested)   LOWER EXTREMITY MMT:     MMT Right eval Left eval  Hip flexion 4 4  Hip extension 4- 4-  Hip abduction 4 4  Hip adduction      Hip internal rotation      Hip external rotation      Knee flexion 4+ 4+  Knee extension 4+ 4+  Ankle dorsiflexion 4+ 4+  Ankle plantarflexion      Ankle inversion      Ankle eversion       (Blank rows = not tested)   LUMBAR SPECIAL TESTS:  Straight leg raise test: Negative, FABER test: Negative, and FADIR Negative    FUNCTIONAL TESTS:  Squat: NT    GAIT: Distance walked: 50 ft  Assistive device utilized: None Level of assistance: Complete Independence Comments: No gait deficits noted    TODAY'S  TREATMENT:                                                                                                                              DATE:    04/30/23:  Nustep level 3 x 6 minutes  Supine bridge 2 x 10  Supine bridge with hip abduction with YTB 2 x 10  Supine bridge with adduction squeeze x 10  Supine TrA activation with white physioball x 10  Supine TrA activation with white physioball and alternating arms x 10 each UE  Seated rows at [facility] 25# 3 x 10   Standing paloff press 10# 2 x 10 each direction   04/09/23:  Hip MMT: -Flex R/L 4/4 -Ext R/L  4/4 -Abd R/L 4/4-   Supine Bridges 1 x 10  Supine Bridges with adduction 2 x 10  FOTO: 57/100 with target of 61  Supine Bridges with hip abduction using yellow band 2 x 10   02/27/23: Nu-Step seat and arms at 10 for 5 min  Supine 90/90 alternating heel touches 2 x 5   Supine Marches 3 x 10 Supine Bridges 3 x 10 Straight Leg Raise 3 x 10   Supine posterior pelvic tilt 3 sec 1 x 10  Supine Bent Knee Fallouts Blue TB 3 x 10    01/30/23   Lower Trunk Rotation 3 x 10 with 3 sec hold  Supine Bridges 3 x 10       PATIENT EDUCATION:  Education details: form and technique for correct performance of exercises  Person educated: Patient Education method: Programmer, multimedia, Demonstration, Verbal cues, and Handouts Education comprehension: verbalized understanding, returned demonstration, and verbal cues required   HOME EXERCISE PROGRAM: Access Code: [email or web address] URL: [email or web address] Date: 02/27/2023 Prepared by: Ellin Goodie  Exercises - Supine Lower Trunk Rotation  - 1 x daily - 2 sets - 10 reps - 3 sec hold  hold - Supine Bridge  - 3-4 x weekly - 3 sets - 10 reps - Supine Active Straight Leg Raise  - 3-4 x weekly - 3 sets - 10 reps - Bent Knee Fallouts  - 3-4 x weekly - 3 sets - 10 reps   ASSESSMENT:   CLINICAL IMPRESSION:   Patient arrived to treatment session motivated to participate. Session focused on core activation in supine, seated, and standing exercises with resistance. Tolerated treatment session well with no increase in pain. 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    OBJECTIVE IMPAIRMENTS: decreased mobility, difficulty walking, decreased strength, hypomobility, obesity, and pain.    ACTIVITY LIMITATIONS: carrying, lifting, bending, standing, squatting, sleeping, locomotion level, and caring for others   PARTICIPATION LIMITATIONS: shopping and occupation   PERSONAL FACTORS: Age, Fitness, Past/current experiences, and Time since onset of injury/illness/exacerbation are also affecting patient's functional outcome.    REHAB  POTENTIAL: Fair chronicity of low back  pain and multiple bouts of PT    CLINICAL DECISION MAKING: Stable/uncomplicated   EVALUATION COMPLEXITY: Low     GOALS: Goals reviewed with patient? No   SHORT TERM GOALS: Target date: 02/13/2023   Pt will be independent with HEP in order to improve strength and balance in order to decrease fall risk and improve function at home and work. Baseline: NT 04/09/23: Able to perform exercises  Goal status: PARTIALLY MET        LONG TERM GOALS: Target date: 04/10/2023   Patient will have improved function and activity level as evidenced by an increase in FOTO score by 10 points or more.  Baseline: 61 with target of 61  Goal status: Deferred    2.  Patient will improve hip strength by >=1/3 grade MMT (ie 4- to 4) and abdominal strength by >=1 level for improved lumbar function and symptom relief.  Baseline: Hip Flex R/L 4/4, Hip Ext R/L 4-/4-, Hip Abd R/L 4/4 , Sahrman Level Defer Goal status: ONGOING    3.  Patient will decrease low back pain to <=5/10 NRPS while performing standing and bending activities required for job as a Runner, broadcasting/film/video and for lifting and carrying tasks required for caring for children.  Baseline: 8/10 NRPS  04/09/23: 8/10 NRPS  Goal status: ONGOING        PLAN:   PT FREQUENCY: 1-2x/week   PT DURATION: 10 weeks   PLANNED INTERVENTIONS: Therapeutic exercises, Neuromuscular re-education, Balance training, Gait training, Patient/Family education, Self Care, Joint mobilization, Joint manipulation, Stair training, DME instructions, Aquatic Therapy, Dry Needling, Electrical stimulation, Spinal manipulation, Spinal mobilization, Cryotherapy, Moist heat, Manual therapy, and Re-evaluation.   PLAN FOR NEXT SESSION: Continue to progress hooklying positions to pt's pain tolerance. Estim for pain control.   Maylon Peppers, PT, DPT Physical Therapist - [place]  [facility]

## 2023-05-02 ENCOUNTER — Encounter: Payer: Self-pay | Admitting: Physical Therapy

## 2023-05-07 ENCOUNTER — Ambulatory Visit: Payer: Worker's Compensation | Attending: Physician Assistant | Admitting: Physical Therapy

## 2023-05-07 DIAGNOSIS — R262 Difficulty in walking, not elsewhere classified: Secondary | ICD-10-CM | POA: Diagnosis present

## 2023-05-07 DIAGNOSIS — M5459 Other low back pain: Secondary | ICD-10-CM | POA: Insufficient documentation

## 2023-05-07 NOTE — Therapy (Unsigned)
OUTPATIENT PHYSICAL THERAPY THORACOLUMBAR TREATMENT   Patient Name: Donna Santiago MRN: [id number] DOB:1986/02/07, 37 y.o., female Today's Date: 01/30/2023   END OF SESSION:   PT End of Session - 05/07/23 1822     Visit Number 5    Number of Visits 20    Date for PT Re-Evaluation 06/10/23    Authorization Type Workers Comp    Authorization Time Period Not listed    Authorization - Visit Number 5    Authorization - Number of Visits 20    Progress Note Due on Visit 10    PT Start Time 1817    PT Stop Time 1900    PT Time Calculation (min) 43 min    Activity Tolerance Patient tolerated treatment well    Behavior During Therapy WFL for tasks assessed/performed                           Past Medical History:  Diagnosis Date   Diabetes mellitus without complication (HCC)     Hypertension     Hypothyroidism     Migraines     Morbid obesity with body mass index (BMI) of 50.0 to 59.9 in adult [facility])               Past Surgical History:  Procedure Laterality Date   DILATION AND CURETTAGE OF UTERUS       ESOPHAGOGASTRODUODENOSCOPY (EGD) WITH PROPOFOL N/A 08/05/2021    Procedure: ESOPHAGOGASTRODUODENOSCOPY (EGD) WITH PROPOFOL;  Surgeon: [name], DO;  Location: [facility] ENDOSCOPY;  Service: Gastroenterology;  Laterality: N/A;  DM        There are no problems to display for this patient.     PCP: Nira Retort    REFERRING PROVIDER: William Hamburger PA    REFERRING DIAG: Chronic low back pain    Rationale for Evaluation and Treatment: Rehabilitation   THERAPY DIAG:  Other low back pain   ONSET DATE: 2022    SUBJECTIVE:                                                                                                                                                                                            SUBJECTIVE STATEMENT: Pt reports no pain during day just when she is sleeping at night. She is able to sit and stand at work without much  difficulty.    PERTINENT HISTORY:  Pt continues to feel increased low back pain that has worsened since last PT plan of care. She reports feeling that pain has spread across a larger area. 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 She mostly feels pain when laying down at night. She has seen physician who said that physical therapy is her best option at this pain.    PAIN:  Are you having pain? Yes: NPRS scale: 5/10 Pain location: L1-L5 Spinous process  Pain description: Achy, sharp and shooting  Aggravating factors: Night time is the worst  Relieving factors: Pain is intermittent but physical therapy is only thing that helps    PRECAUTIONS: None   RED FLAGS: None      WEIGHT BEARING RESTRICTIONS: No   FALLS:  Has patient fallen in last 6 months? No   LIVING ENVIRONMENT: Lives with: lives with their family Lives in: House/apartment Stairs: Yes: External: 2 steps; none Has following equipment at home: None   OCCUPATION: Tourist information centre manager    PLOF: Independent   PATIENT GOALS: Reduce low back pain    NEXT MD VISIT: September 2024    OBJECTIVE:    VITAL BP 118/86 HR 80 SpO2 97    DIAGNOSTIC FINDINGS:  No diagnostic findings available to view in chart. Can find by contacting [facility]    PATIENT SURVEYS:  FOTO 61/100 with expected score of 61    SCREENING FOR RED FLAGS: Bowel or bladder incontinence: No Spinal tumors: No Cauda equina syndrome: No Compression fracture: No Abdominal aneurysm: No   COGNITION: Overall cognitive status: Within functional limits for tasks assessed                          SENSATION: Light touch: Impaired   Lateral side of left side of low back down lateral side to lateral femoral condyle   MUSCLE LENGTH: Hamstrings: Right 70 deg; Left 70 deg Thomas test: Right NT deg; Left NT deg   POSTURE: rounded shoulders   PALPATION: T12-L5 Central spinous process TTP    LUMBAR ROM:    AROM  eval  Flexion 90%  Extension 50%*  Right lateral flexion 75%*  Left lateral flexion 75%*  Right rotation 100%  Left rotation 100%   (Blank rows = not tested)   LOWER EXTREMITY ROM:      Active  Right eval Left eval  Hip flexion      Hip extension      Hip abduction      Hip adduction      Hip internal rotation      Hip external rotation      Knee flexion      Knee extension      Ankle dorsiflexion      Ankle plantarflexion      Ankle inversion      Ankle eversion       (Blank rows = not tested)   LOWER EXTREMITY MMT:     MMT Right eval Left eval  Hip flexion 4 4  Hip extension 4- 4-  Hip abduction 4 4  Hip adduction      Hip internal rotation      Hip external rotation      Knee flexion 4+ 4+  Knee extension 4+ 4+  Ankle dorsiflexion 4+ 4+  Ankle plantarflexion      Ankle inversion      Ankle eversion       (Blank rows = not tested)   LUMBAR SPECIAL TESTS:  Straight leg raise test: Negative, FABER test: Negative, and FADIR Negative    FUNCTIONAL TESTS:  Squat: NT    GAIT: Distance walked:  50 ft  Assistive device utilized: None Level of assistance: Complete Independence Comments: No gait deficits noted    TODAY'S TREATMENT:                                                                                                                              DATE:    05/07/23: Nustep seat and arms at 9 with resistance level at 3 x 6 minutes  Supine Bridges 3 x 10  Supine Bridges with Hip Abduction with green band 1 x 10  Standing Mini-Squat 1 x 10  Standing Mini-Squat with Hip ER with green band 1 x 10  -Pt reports increased low back pain  E-Stim with TENS 3000 with 1 lead only set to 2.0  -Pt reports decreased low back pain  Standing Marches 2 x 10  Seated Marches       04/30/23:  Nustep level 3 x 6 minutes  Supine bridge 2 x 10  Supine bridge with hip abduction with YTB 2 x 10  Supine bridge with adduction squeeze x 10  Supine TrA activation with  white physioball x 10  Supine TrA activation with white physioball and alternating arms x 10 each UE  Seated rows at OMEGA 25# 3 x 10   Standing paloff press 10# 2 x 10 each direction   04/09/23:  Hip MMT: -Flex R/L 4/4 -Ext R/L  4/4 -Abd R/L 4/4-   Supine Bridges 1 x 10  Supine Bridges with adduction 2 x 10  FOTO: 57/100 with target of 61  Supine Bridges with hip abduction using yellow band 2 x 10       PATIENT EDUCATION:  Education details: form and technique for correct performance of exercises  Person educated: Patient Education method: Programmer, multimedia, Demonstration, Verbal cues, and Handouts Education comprehension: verbalized understanding, returned demonstration, and verbal cues required   HOME EXERCISE PROGRAM: Access Code: [email or web address] URL: [email or web address] Date: 05/07/2023 Prepared by: Ellin Goodie  Exercises - Supine Lower Trunk Rotation  - 1 x daily - 2 sets - 10 reps - 3 sec hold  hold - Squat with Chair Touch and Resistance Loop  - 3-4 x weekly - 3 sets - 10 reps - Seated March with Resistance  - 3-4 x weekly - 3 sets - 10 reps   ASSESSMENT:   CLINICAL IMPRESSION:   Patient arrived to treatment session motivated to participate. Session focused on core activation in supine, seated, and standing exercises with resistance. Tolerated treatment session well with no increase in pain. 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    OBJECTIVE IMPAIRMENTS: decreased mobility, difficulty walking, decreased strength, hypomobility, obesity, and pain.    ACTIVITY LIMITATIONS: carrying, lifting, bending, standing, squatting, sleeping, locomotion level, and caring for others   PARTICIPATION LIMITATIONS: shopping and occupation   PERSONAL  FACTORS: Age, Fitness, Past/current experiences, and Time  since onset of injury/illness/exacerbation are also affecting patient's functional outcome.    REHAB POTENTIAL: Fair chronicity of low back pain and multiple bouts of PT    CLINICAL DECISION MAKING: Stable/uncomplicated   EVALUATION COMPLEXITY: Low     GOALS: Goals reviewed with patient? No   SHORT TERM GOALS: Target date: 02/13/2023   Pt will be independent with HEP in order to improve strength and balance in order to decrease fall risk and improve function at home and work. Baseline: NT 04/09/23: Able to perform exercises  Goal status: PARTIALLY MET        LONG TERM GOALS: Target date: 04/10/2023   Patient will have improved function and activity level as evidenced by an increase in FOTO score by 10 points or more.  Baseline: 61 with target of 61  Goal status: Deferred    2.  Patient will improve hip strength by >=1/3 grade MMT (ie 4- to 4) and abdominal strength by >=1 level for improved lumbar function and symptom relief.  Baseline: Hip Flex R/L 4/4, Hip Ext R/L 4-/4-, Hip Abd R/L 4/4 , Sahrman Level Defer Goal status: ONGOING    3.  Patient will decrease low back pain to <=5/10 NRPS while performing standing and bending activities required for job as a Runner, broadcasting/film/video and for lifting and carrying tasks required for caring for children.  Baseline: 8/10 NRPS  04/09/23: 8/10 NRPS  Goal status: ONGOING        PLAN:   PT FREQUENCY: 1-2x/week   PT DURATION: 10 weeks   PLANNED INTERVENTIONS: Therapeutic exercises, Neuromuscular re-education, Balance training, Gait training, Patient/Family education, Self Care, Joint mobilization, Joint manipulation, Stair training, DME instructions, Aquatic Therapy, Dry Needling, Electrical stimulation, Spinal manipulation, Spinal mobilization, Cryotherapy, Moist heat, Manual therapy, and Re-evaluation.   PLAN FOR NEXT SESSION: Continue to progress hooklying positions to pt's pain tolerance. Estim for pain control.   Maylon Peppers, PT, DPT Physical  Therapist - [place]  [facility]

## 2023-05-09 ENCOUNTER — Encounter: Payer: Self-pay | Admitting: Physical Therapy

## 2023-05-14 ENCOUNTER — Ambulatory Visit: Payer: Worker's Compensation | Admitting: Physical Therapy

## 2023-05-14 DIAGNOSIS — M5459 Other low back pain: Secondary | ICD-10-CM

## 2023-05-14 DIAGNOSIS — R262 Difficulty in walking, not elsewhere classified: Secondary | ICD-10-CM

## 2023-05-14 NOTE — Therapy (Unsigned)
OUTPATIENT PHYSICAL THERAPY THORACOLUMBAR TREATMENT   Patient Name: Donna Santiago MRN: [id number] DOB:08-19-1985, 37 y.o., female Today's Date: 01/30/2023   END OF SESSION:   PT End of Session - 05/14/23 1731     Visit Number 6    Number of Visits 20    Date for PT Re-Evaluation 06/10/23    Authorization Type Workers Comp    Authorization Time Period Not listed    Authorization - Visit Number 6    Authorization - Number of Visits 20    Progress Note Due on Visit 10    PT Start Time 1730    PT Stop Time 1815    PT Time Calculation (min) 45 min    Activity Tolerance Patient tolerated treatment well    Behavior During Therapy WFL for tasks assessed/performed                            Past Medical History:  Diagnosis Date   Diabetes mellitus without complication (HCC)     Hypertension     Hypothyroidism     Migraines     Morbid obesity with body mass index (BMI) of 50.0 to 59.9 in adult [facility])               Past Surgical History:  Procedure Laterality Date   DILATION AND CURETTAGE OF UTERUS       ESOPHAGOGASTRODUODENOSCOPY (EGD) WITH PROPOFOL N/A 08/05/2021    Procedure: ESOPHAGOGASTRODUODENOSCOPY (EGD) WITH PROPOFOL;  Surgeon: [name], DO;  Location: [facility] ENDOSCOPY;  Service: Gastroenterology;  Laterality: N/A;  DM        There are no problems to display for this patient.     PCP: Nira Retort    REFERRING PROVIDER: William Hamburger PA    REFERRING DIAG: Chronic low back pain    Rationale for Evaluation and Treatment: Rehabilitation   THERAPY DIAG:  Other low back pain   ONSET DATE: 2022    SUBJECTIVE:                                                                                                                                                                                            SUBJECTIVE STATEMENT: Pt states that she continues to experience most of her pain at night.    PERTINENT HISTORY:  Pt continues to feel  increased low back pain that has worsened since last PT plan of care. She reports feeling that pain has spread across a larger area. 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 She mostly feels pain  when laying down at night. She has seen physician who said that physical therapy is her best option at this pain.    PAIN:  Are you having pain? Yes: NPRS scale: 3-4/10 Pain location: L1-L5 Spinous process  Pain description: Achy, sharp and shooting  Aggravating factors: Night time is the worst  Relieving factors: Pain is intermittent but physical therapy is only thing that helps    PRECAUTIONS: None   RED FLAGS: None      WEIGHT BEARING RESTRICTIONS: No   FALLS:  Has patient fallen in last 6 months? No   LIVING ENVIRONMENT: Lives with: lives with their family Lives in: House/apartment Stairs: Yes: External: 2 steps; none Has following equipment at home: None   OCCUPATION: Tourist information centre manager    PLOF: Independent   PATIENT GOALS: Reduce low back pain    NEXT MD VISIT: September 2024    OBJECTIVE:    VITAL BP 118/86 HR 80 SpO2 97    DIAGNOSTIC FINDINGS:  No diagnostic findings available to view in chart. Can find by contacting [facility]    PATIENT SURVEYS:  FOTO 61/100 with expected score of 61    SCREENING FOR RED FLAGS: Bowel or bladder incontinence: No Spinal tumors: No Cauda equina syndrome: No Compression fracture: No Abdominal aneurysm: No   COGNITION: Overall cognitive status: Within functional limits for tasks assessed                          SENSATION: Light touch: Impaired   Lateral side of left side of low back down lateral side to lateral femoral condyle   MUSCLE LENGTH: Hamstrings: Right 70 deg; Left 70 deg Thomas test: Right NT deg; Left NT deg   POSTURE: rounded shoulders   PALPATION: T12-L5 Central spinous process TTP    LUMBAR ROM:    AROM eval  Flexion 90%  Extension 50%*  Right lateral  flexion 75%*  Left lateral flexion 75%*  Right rotation 100%  Left rotation 100%   (Blank rows = not tested)   LOWER EXTREMITY ROM:      Active  Right eval Left eval  Hip flexion      Hip extension      Hip abduction      Hip adduction      Hip internal rotation      Hip external rotation      Knee flexion      Knee extension      Ankle dorsiflexion      Ankle plantarflexion      Ankle inversion      Ankle eversion       (Blank rows = not tested)   LOWER EXTREMITY MMT:     MMT Right eval Left eval  Hip flexion 4 4  Hip extension 4- 4-  Hip abduction 4 4  Hip adduction      Hip internal rotation      Hip external rotation      Knee flexion 4+ 4+  Knee extension 4+ 4+  Ankle dorsiflexion 4+ 4+  Ankle plantarflexion      Ankle inversion      Ankle eversion       (Blank rows = not tested)   LUMBAR SPECIAL TESTS:  Straight leg raise test: Negative, FABER test: Negative, and FADIR Negative    FUNCTIONAL TESTS:  Squat: NT    GAIT: Distance walked: 50 ft  Assistive device utilized: None Level of assistance: Complete  Independence Comments: No gait deficits noted    TODAY'S TREATMENT:                                                                                                                              DATE:    05/14/23: Nustep seat and arms at 9 with resistance level at 3 x 6 minutes  Hip MMT:  Hip Flex R/L Nov 03, 2022  Hip Abd R/L 11/03/22  Hip Ext  R/L 11/03/2022 Dead buG  05-Jun-2023: Nustep seat and arms at 9 with resistance level at 3 x 6 minutes  Supine Bridges 3 x 10  Supine Bridges with Hip Abduction with green band 1 x 10  Standing Mini-Squat 1 x 10  Standing Mini-Squat with Hip ER with green band 1 x 10  -Pt reports increased low back pain  E-Stim with TENS 3000 with 1 lead only set to 2.0  -Pt reports decreased low back pain  Standing Marches 2 x 10  Seated Marches with green theraband 2 x 10   04/30/23:  Nustep level 3 x 6 minutes  Supine bridge 2 x 10   Supine bridge with hip abduction with YTB 2 x 10  Supine bridge with adduction squeeze x 10  Supine TrA activation with white physioball x 10  Supine TrA activation with white physioball and alternating arms x 10 each UE  Seated rows at OMEGA 25# 3 x 10   Standing paloff press 10# 2 x 10 each direction    PATIENT EDUCATION:  Education details: form and technique for correct performance of exercises  Person educated: Patient Education method: Explanation, Demonstration, Verbal cues, and Handouts Education comprehension: verbalized understanding, returned demonstration, and verbal cues required   HOME EXERCISE PROGRAM: Access Code: [email or web address] URL: [email or web address] Date: 05/14/2023 Prepared by: Ellin Goodie  Exercises - Supine Lower Trunk Rotation  - 1 x daily - 2 sets - 10 reps - 3 sec hold  hold - Squat with Chair Touch and Resistance Loop  - 3-4 x weekly - 3 sets - 10 reps - Seated March with Resistance  - 3-4 x weekly - 3 sets - 10 reps - Standing Hip Abduction with Counter Support  - 3-4 x weekly - 3 sets - 10 reps   ASSESSMENT:   CLINICAL IMPRESSION:   Pt demonstrates progression towards goals improvement with hip strength and decrease in low back pain. However, pt continues to be limited by left sided low back pain especially when side lying on left side or with leg lifts, which triggers lumbar extension. Modified home exercise plan to include additional abdominal and glute med strengthening exercises that pt performed without an increase in her low back pain. 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   OBJECTIVE IMPAIRMENTS: decreased mobility, difficulty walking, decreased strength, hypomobility, obesity, and  pain.    ACTIVITY LIMITATIONS: carrying, lifting, bending, standing, squatting,  sleeping, locomotion level, and caring for others   PARTICIPATION LIMITATIONS: shopping and occupation   PERSONAL FACTORS: Age, Fitness, Past/current experiences, and Time since onset of injury/illness/exacerbation are also affecting patient's functional outcome.    REHAB POTENTIAL: Fair chronicity of low back pain and multiple bouts of PT    CLINICAL DECISION MAKING: Stable/uncomplicated   EVALUATION COMPLEXITY: Low     GOALS: Goals reviewed with patient? No   SHORT TERM GOALS: Target date: 02/13/2023   Pt will be independent with HEP in order to improve strength and balance in order to decrease fall risk and improve function at home and work. Baseline: NT 04/09/23: Able to perform exercises  Goal status: PARTIALLY MET        LONG TERM GOALS: Target date: 04/10/2023   Patient will have improved function and activity level as evidenced by an increase in FOTO score by 10 points or more.  Baseline: 61 with target of 61  Goal status: Deferred    2.  Patient will improve hip strength by >=1/3 grade MMT (ie 4- to 4) and abdominal strength by >=1 level for improved lumbar function and symptom relief.  Baseline: Hip Flex R/L 4/4, Hip Ext R/L 4-/4-, Hip Abd R/L 4/4 , Sahrman Level Defer 05/14/23: Hip Flex R/L 4/4, Hip Ext R/L 4/4, Hip Flex R/L 4/4  Goal status: ONGOING    3.  Patient will decrease low back pain to <=5/10 NRPS while performing standing and bending activities required for job as a Runner, broadcasting/film/video and for lifting and carrying tasks required for caring for children.  Baseline: 8/10 NRPS  04/09/23: 8/10 NRPS 05/14/23: 5/10 NRPS  Goal status: ONGOING        PLAN:   PT FREQUENCY: 1-2x/week   PT DURATION: 10 weeks   PLANNED INTERVENTIONS: Therapeutic exercises, Neuromuscular re-education, Balance training, Gait training, Patient/Family education, Self Care, Joint mobilization, Joint manipulation, Stair training, DME instructions, Aquatic Therapy, Dry Needling, Electrical  stimulation, Spinal manipulation, Spinal mobilization, Cryotherapy, Moist heat, Manual therapy, and Re-evaluation.   PLAN FOR NEXT SESSION: Continue with progression of home exercises including additional resistance and progression to positions with additional gravity.   Ellin Goodie, PT, DPT Physical Therapist -   [facility]

## 2023-05-16 ENCOUNTER — Encounter: Payer: Self-pay | Admitting: Physical Therapy

## 2023-05-21 ENCOUNTER — Encounter: Payer: Self-pay | Admitting: Physical Therapy

## 2023-05-21 ENCOUNTER — Ambulatory Visit: Payer: Worker's Compensation | Admitting: Physical Therapy

## 2023-05-21 DIAGNOSIS — M5459 Other low back pain: Secondary | ICD-10-CM

## 2023-05-21 DIAGNOSIS — R262 Difficulty in walking, not elsewhere classified: Secondary | ICD-10-CM

## 2023-05-21 NOTE — Therapy (Signed)
OUTPATIENT PHYSICAL THERAPY THORACOLUMBAR TREATMENT   Patient Name: Donna Santiago MRN: [id number] DOB:05-01-1986, 37 y.o., female Today's Date: 01/30/2023   END OF SESSION:   PT End of Session - 05/21/23 1733     Visit Number 7    Number of Visits 20    Date for PT Re-Evaluation 06/10/23    Authorization Type Workers Comp    Authorization Time Period Not listed    Authorization - Visit Number 7    Authorization - Number of Visits 20    Progress Note Due on Visit 10    PT Start Time 1730    PT Stop Time 1815    PT Time Calculation (min) 45 min    Activity Tolerance Patient tolerated treatment well    Behavior During Therapy WFL for tasks assessed/performed                            Past Medical History:  Diagnosis Date   Diabetes mellitus without complication (HCC)     Hypertension     Hypothyroidism     Migraines     Morbid obesity with body mass index (BMI) of 50.0 to 59.9 in adult [facility])               Past Surgical History:  Procedure Laterality Date   DILATION AND CURETTAGE OF UTERUS       ESOPHAGOGASTRODUODENOSCOPY (EGD) WITH PROPOFOL N/A 08/05/2021    Procedure: ESOPHAGOGASTRODUODENOSCOPY (EGD) WITH PROPOFOL;  Surgeon: [name], DO;  Location: [facility] ENDOSCOPY;  Service: Gastroenterology;  Laterality: N/A;  DM        There are no problems to display for this patient.     PCP: Nira Retort    REFERRING PROVIDER: William Hamburger PA    REFERRING DIAG: Chronic low back pain    Rationale for Evaluation and Treatment: Rehabilitation   THERAPY DIAG:  Other low back pain   ONSET DATE: 2022    SUBJECTIVE:                                                                                                                                                                                            SUBJECTIVE STATEMENT: Pt reports that she felt a significant amount of pain after last session to the point where she was feeling pain for  the rest of the week.    PERTINENT HISTORY:  Pt continues to feel increased low back pain that has worsened since last PT plan of care. She reports feeling that pain has spread across a larger area. 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 She mostly feels pain when laying down at night. She has seen physician who said that physical therapy is her best option at this pain.    PAIN:  Are you having pain? Yes: NPRS scale: 3-4/10 Pain location: L1-L5 Spinous process  Pain description: Achy, sharp and shooting  Aggravating factors: Night time is the worst  Relieving factors: Pain is intermittent but physical therapy is only thing that helps    PRECAUTIONS: None   RED FLAGS: None      WEIGHT BEARING RESTRICTIONS: No   FALLS:  Has patient fallen in last 6 months? No   LIVING ENVIRONMENT: Lives with: lives with their family Lives in: House/apartment Stairs: Yes: External: 2 steps; none Has following equipment at home: None   OCCUPATION: Tourist information centre manager    PLOF: Independent   PATIENT GOALS: Reduce low back pain    NEXT MD VISIT: September 2024    OBJECTIVE:    VITAL BP 118/86 HR 80 SpO2 97    DIAGNOSTIC FINDINGS:  No diagnostic findings available to view in chart. Can find by contacting [facility]    PATIENT SURVEYS:  FOTO 61/100 with expected score of 61    SCREENING FOR RED FLAGS: Bowel or bladder incontinence: No Spinal tumors: No Cauda equina syndrome: No Compression fracture: No Abdominal aneurysm: No   COGNITION: Overall cognitive status: Within functional limits for tasks assessed                          SENSATION: Light touch: Impaired   Lateral side of left side of low back down lateral side to lateral femoral condyle   MUSCLE LENGTH: Hamstrings: Right 70 deg; Left 70 deg Thomas test: Right NT deg; Left NT deg   POSTURE: rounded shoulders   PALPATION: T12-L5 Central spinous process TTP    LUMBAR  ROM:    AROM eval  Flexion 90%  Extension 50%*  Right lateral flexion 75%*  Left lateral flexion 75%*  Right rotation 100%  Left rotation 100%   (Blank rows = not tested)   LOWER EXTREMITY ROM:      Active  Right eval Left eval  Hip flexion      Hip extension      Hip abduction      Hip adduction      Hip internal rotation      Hip external rotation      Knee flexion      Knee extension      Ankle dorsiflexion      Ankle plantarflexion      Ankle inversion      Ankle eversion       (Blank rows = not tested)   LOWER EXTREMITY MMT:     MMT Right eval Left eval  Hip flexion 4 4  Hip extension 4- 4-  Hip abduction 4 4  Hip adduction      Hip internal rotation      Hip external rotation      Knee flexion 4+ 4+  Knee extension 4+ 4+  Ankle dorsiflexion 4+ 4+  Ankle plantarflexion      Ankle inversion      Ankle eversion       (Blank rows = not tested)   LUMBAR SPECIAL TESTS:  Straight leg raise test: Negative, FABER test: Negative, and FADIR Negative    FUNCTIONAL TESTS:  Squat: NT  GAIT: Distance walked: 50 ft  Assistive device utilized: None Level of assistance: Complete Independence Comments: No gait deficits noted    TODAY'S TREATMENT:                                                                                                                              DATE:    05/21/23: Nustep seat and arms at 10 with resistance level at 3 x 6 minutes  Seated TA Activation with 2 sec hold 1 x 10 Standing Hip Abduction with BUE support 1 x 10  Standing Hip Abduction with BUE support and yellow band 2 x 10  Standing Hip Extension with forward bend 1 x 10  Standing Hip Extension with forward bend and use of yellow band 1 x 10   05/14/23: Nustep seat and arms at 9 with resistance level at 3 x 6 minutes  Hip MMT:  Hip Flex R/L 29-Oct-2022  Hip Abd R/L 10/29/22  Hip Ext  R/L 10-29-22 Dead buG  May 31, 2023: Nustep seat and arms at 9 with resistance level at 3 x 6 minutes   Supine Bridges 3 x 10  Supine Bridges with Hip Abduction with green band 1 x 10  Standing Mini-Squat 1 x 10  Standing Mini-Squat with Hip ER with green band 1 x 10  -Pt reports increased low back pain  E-Stim with TENS 3000 with 1 lead only set to 2.0  -Pt reports decreased low back pain  Standing Marches 2 x 10  Seated Marches with green theraband 2 x 10   04/30/23:  Nustep level 3 x 6 minutes  Supine bridge 2 x 10  Supine bridge with hip abduction with YTB 2 x 10  Supine bridge with adduction squeeze x 10  Supine TrA activation with white physioball x 10  Supine TrA activation with white physioball and alternating arms x 10 each UE  Seated rows at OMEGA 25# 3 x 10   Standing paloff press 10# 2 x 10 each direction    PATIENT EDUCATION:  Education details: form and technique for correct performance of exercises  Person educated: Patient Education method: Explanation, Demonstration, Verbal cues, and Handouts Education comprehension: verbalized understanding, returned demonstration, and verbal cues required   HOME EXERCISE PROGRAM: Access Code: [email or web address] URL: [email or web address] Date: 05/21/2023 Prepared by: Ellin Goodie  Exercises - Supine Lower Trunk Rotation  - 1 x daily - 2 sets - 10 reps - 3 sec hold  hold - Standing Gluteal Stretch on Chair  - 3-4 x weekly - 3 reps - 30 sec  hold - Standing Hip Abduction with Counter Support  - 3-4 x weekly - 3 sets - 10 reps - Seated Transversus Abdominis Bracing  - 3-4 x weekly - 3 sets - 10 reps - 2 sec  hold - Standing Hip Extension with Resistance at Ankles and Counter Support  - 3-4 x weekly - 3 sets - 10 reps   ASSESSMENT:  CLINICAL IMPRESSION:   Despite pt presenting with high pain, she was still able to complete all exercises without an increase in her low back pain. RLE exercises appeared to be more activating than LLE especially with glute med strengthening exercises. Modified exercises with replacement  of mini-squat with hip extension. 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   OBJECTIVE IMPAIRMENTS: decreased mobility, difficulty walking, decreased strength, hypomobility, obesity, and pain.    ACTIVITY LIMITATIONS: carrying, lifting, bending, standing, squatting, sleeping, locomotion level, and caring for others   PARTICIPATION LIMITATIONS: shopping and occupation   PERSONAL FACTORS: Age, Fitness, Past/current experiences, and Time since onset of injury/illness/exacerbation are also affecting patient's functional outcome.    REHAB POTENTIAL: Fair chronicity of low back pain and multiple bouts of PT    CLINICAL DECISION MAKING: Stable/uncomplicated   EVALUATION COMPLEXITY: Low     GOALS: Goals reviewed with patient? No   SHORT TERM GOALS: Target date: 02/13/2023   Pt will be independent with HEP in order to improve strength and balance in order to decrease fall risk and improve function at home and work. Baseline: NT 04/09/23: Able to perform exercises  Goal status: PARTIALLY MET        LONG TERM GOALS: Target date: 04/10/2023   Patient will have improved function and activity level as evidenced by an increase in FOTO score by 10 points or more.  Baseline: 61 with target of 61  Goal status: Deferred    2.  Patient will improve hip strength by >=1/3 grade MMT (ie 4- to 4) and abdominal strength by >=1 level for improved lumbar function and symptom relief.  Baseline: Hip Flex R/L 4/4, Hip Ext R/L 4-/4-, Hip Abd R/L 4/4 , [place]an Level Defer 05/14/23: Hip Flex R/L 4/4, Hip Ext R/L 4/4, Hip Flex R/L 4/4  Goal status: ONGOING    3.  Patient will decrease low back pain to <=5/10 NRPS while performing standing and bending activities required for job as a Runner, broadcasting/film/video and for lifting and carrying tasks required for caring for  children.  Baseline: 8/10 NRPS  04/09/23: 8/10 NRPS 05/14/23: 5/10 NRPS  Goal status: ONGOING        PLAN:   PT FREQUENCY: 1-2x/week   PT DURATION: 10 weeks   PLANNED INTERVENTIONS: Therapeutic exercises, Neuromuscular re-education, Balance training, Gait training, Patient/Family education, Self Care, Joint mobilization, Joint manipulation, Stair training, DME instructions, Aquatic Therapy, Dry Needling, Electrical stimulation, Spinal manipulation, Spinal mobilization, Cryotherapy, Moist heat, Manual therapy, and Re-evaluation.   PLAN FOR NEXT SESSION: Monster walks and re-attempt mini-squat.   Ellin Goodie, PT, DPT Physical Therapist - [place]  [facility]

## 2023-05-23 ENCOUNTER — Encounter: Payer: Self-pay | Admitting: Physical Therapy

## 2023-05-28 ENCOUNTER — Ambulatory Visit: Payer: Worker's Compensation | Admitting: Physical Therapy

## 2023-05-30 ENCOUNTER — Encounter: Payer: Self-pay | Admitting: Physical Therapy

## 2023-06-04 ENCOUNTER — Ambulatory Visit: Payer: Worker's Compensation | Admitting: Physical Therapy

## 2023-06-11 ENCOUNTER — Encounter: Payer: Self-pay | Admitting: Physical Therapy

## 2023-06-13 ENCOUNTER — Encounter: Payer: Self-pay | Admitting: Physical Therapy

## 2023-08-11 ENCOUNTER — Telehealth: Payer: Self-pay | Admitting: Family Medicine

## 2023-08-11 DIAGNOSIS — J069 Acute upper respiratory infection, unspecified: Secondary | ICD-10-CM

## 2023-08-11 MED ORDER — PROMETHAZINE-DM 6.25-15 MG/5ML PO SYRP: 5.0000 mL | ORAL_SOLUTION | Freq: Four times a day (QID) | ORAL | 0 refills | Status: AC | PRN

## 2023-08-11 MED ORDER — DOXYCYCLINE HYCLATE 100 MG PO TABS: 100.0000 mg | ORAL_TABLET | Freq: Two times a day (BID) | ORAL | 0 refills | Status: AC

## 2023-08-11 NOTE — Progress Notes (Signed)
 Virtual Visit Consent   [name], you are scheduled for a virtual visit with a [facility] provider today. Just as with appointments in the office, your consent must be obtained to participate. Your consent will be active for this visit and any virtual visit you may have with one of our providers in the next 365 days. If you have a MyChart account, a copy of this consent can be sent to you electronically.  As this is a virtual visit, video technology does not allow for your provider to perform a traditional examination. This may limit your provider's ability to fully assess your condition. If your provider identifies any concerns that need to be evaluated in person or the need to arrange testing (such as labs, EKG, etc.), we will make arrangements to do so. Although advances in technology are sophisticated, we cannot ensure that it will always work on either your end or our end. If the connection with a video visit is poor, the visit may have to be switched to a telephone visit. With either a video or telephone visit, we are not always able to ensure that we have a secure connection.  By engaging in this virtual visit, you consent to the provision of healthcare and authorize for your insurance to be billed (if applicable) for the services provided during this visit. Depending on your insurance coverage, you may receive a charge related to this service.  I need to obtain your verbal consent now. Are you willing to proceed with your visit today? [name] has provided verbal consent on 08/11/2023 for a virtual visit (video or telephone). [name], FNP  Date: 08/11/2023 10:35 AM  Virtual Visit via Video Note   I, [name], connected with  [name]  ([id number], 10-04-1985) on 08/11/23 at 10:30 AM EST by a video-enabled telemedicine application and verified that I am speaking with the correct person using two identifiers.  Location: Patient: Virtual Visit Location Patient:  Home Provider: Virtual Visit Location Provider: Home Office   I discussed the limitations of evaluation and management by telemedicine and the availability of in person appointments. The patient expressed understanding and agreed to proceed.    History of Present Illness: Donna Santiago is a 38 y.o. who identifies as a female who was assigned female at birth, and is being seen today for nausea, diarrhea, sinus congestion and prod cough with green mucus for almost 2 weeks. Started with flu sx and progressed. No fever now. No wheezing or sob. [name]  HPI: HPI  Problems: There are no active problems to display for this patient.   Allergies:  Allergies  Allergen Reactions   Amoxicillin Swelling   Penicillins    Prednisone    Medications:  Current Outpatient Medications:    dicyclomine  (BENTYL ) 10 MG capsule, Take 1 capsule (10 mg total) by mouth 4 (four) times daily -  before meals and at bedtime for 5 days., Disp: 20 capsule, Rfl: 0   diphenhydrAMINE  (BENADRYL ) 25 mg capsule, Take 2 capsules (50 mg total) by mouth every 6 (six) hours as needed., Disp: 60 capsule, Rfl: 0   Dulaglutide (TRULICITY) 3 MG/0.5ML SOPN, Inject into the skin., Disp: , Rfl:    glipiZIDE  (GLUCOTROL  XL) 5 MG 24 hr tablet, Take 1 tablet (5 mg total) by mouth daily., Disp: 30 tablet, Rfl: 0   hydrochlorothiazide (HYDRODIURIL) 25 MG tablet, Take 25 mg by mouth daily., Disp: , Rfl:    hyoscyamine (LEVSIN SL) 0.125 MG SL tablet, Place 0.125 mg  under the tongue every 4 (four) hours as needed., Disp: , Rfl:    ibuprofen  (ADVIL ) 600 MG tablet, Take 1 tablet (600 mg total) by mouth every 8 (eight) hours as needed for moderate pain., Disp: 20 tablet, Rfl: 0   levothyroxine (SYNTHROID, LEVOTHROID) 112 MCG tablet, Take 1 tablet by mouth 2 (two) times daily. , Disp: , Rfl:    losartan (COZAAR) 100 MG tablet, Take 100 mg by mouth daily., Disp: , Rfl:    metFORMIN (GLUCOPHAGE) 1000 MG tablet, Take 1,000 mg by mouth daily with  breakfast., Disp: , Rfl:    metoCLOPramide  (REGLAN ) 10 MG tablet, Take 1 tablet (10 mg total) by mouth every 6 (six) hours as needed., Disp: 30 tablet, Rfl: 0   naproxen  (NAPROSYN ) 500 MG tablet, Take 1 tablet (500 mg total) by mouth 2 (two) times daily with a meal., Disp: 20 tablet, Rfl: 0   norethindrone-ethinyl estradiol-iron (JUNEL FE 1.5/30) 1.5-30 MG-MCG tablet, Take 1 tablet by mouth daily., Disp: , Rfl:    pantoprazole (PROTONIX) 40 MG tablet, Take 40 mg by mouth daily., Disp: , Rfl:    promethazine  (PHENERGAN ) 25 MG tablet, Take 1 tablet (25 mg total) by mouth every 8 (eight) hours as needed for nausea or vomiting., Disp: 15 tablet, Rfl: 0   SUMAtriptan  (IMITREX ) 100 MG tablet, Take 1 tablet (100 mg total) by mouth daily as needed., Disp: 10 tablet, Rfl: 0  Observations/Objective: Patient is well-developed, well-nourished in no acute distress.  Resting comfortably  at home.  Head is normocephalic, atraumatic.  No labored breathing.  Speech is clear and coherent with logical content.  Patient is alert and oriented at baseline.    Assessment and Plan: 1. Upper respiratory tract infection, unspecified type (Primary)  Increase fluids, no milk or dairy, progress diet as tolerated, UC if sx persist or worsen.   Follow Up Instructions: I discussed the assessment and treatment plan with the patient. The patient was provided an opportunity to ask questions and all were answered. The patient agreed with the plan and demonstrated an understanding of the instructions.  A copy of instructions were sent to the patient via MyChart unless otherwise noted below.     The patient was advised to call back or seek an in-person evaluation if the symptoms worsen or if the condition fails to improve as anticipated.    Mister [name], FNP

## 2023-08-11 NOTE — Patient Instructions (Signed)

## 2023-11-24 ENCOUNTER — Encounter: Payer: Self-pay | Admitting: *Deleted

## 2023-11-24 ENCOUNTER — Other Ambulatory Visit: Payer: Self-pay

## 2023-11-24 ENCOUNTER — Ambulatory Visit
Admission: EM | Admit: 2023-11-24 | Discharge: 2023-11-24 | Disposition: A | Discharge status: Home / Self Care | Payer: Self-pay | Attending: Emergency Medicine | Admitting: Emergency Medicine

## 2023-11-24 DIAGNOSIS — R81 Glycosuria: Secondary | ICD-10-CM | POA: Diagnosis not present

## 2023-11-24 DIAGNOSIS — R300 Dysuria: Secondary | ICD-10-CM | POA: Diagnosis present

## 2023-11-24 DIAGNOSIS — N898 Other specified noninflammatory disorders of vagina: Secondary | ICD-10-CM | POA: Diagnosis not present

## 2023-11-24 LAB — POCT URINALYSIS DIP (MANUAL ENTRY)
Bilirubin, UA: NEGATIVE
Glucose, UA: 500 mg/dL — AB
Ketones, POC UA: NEGATIVE mg/dL
Leukocytes, UA: NEGATIVE
Nitrite, UA: NEGATIVE
Protein Ur, POC: 100 mg/dL — AB
Spec Grav, UA: 1.02
Urobilinogen, UA: 0.2 U/dL
pH, UA: 6

## 2023-11-24 LAB — POCT FASTING CBG KUC MANUAL ENTRY: POCT Glucose (KUC): 334 mg/dL — AB (ref 70–99)

## 2023-11-24 MED ORDER — FLUCONAZOLE 150 MG PO TABS: 150.0000 mg | ORAL_TABLET | ORAL | 0 refills | Status: AC | PRN

## 2023-11-24 NOTE — ED Provider Notes (Signed)
 Donna Santiago    CSN: [id number] Arrival date & time: 11/24/23  0931      History   Chief Complaint Chief Complaint  Patient presents with   Vaginal Discharge   Dysuria    HPI Donna Santiago is a 38 y.o. female.   Presents for evaluation of vaginal itching, Donna Santiago thick to thin vaginal discharge, intermittent lower abdominal and lower back pain, dysuria and urinary frequency present for 2 weeks.  Has attempted use of over-the-counter yeast medications without improvement.  Denies vaginal odor, hematuria, fever.  Past Medical History:  Diagnosis Date   Diabetes mellitus without complication (HCC)    Hypertension    Hypothyroidism    Migraines    Morbid obesity with body mass index (BMI) of 50.0 to 59.9 in adult [facility])     There are no active problems to display for this patient.   Past Surgical History:  Procedure Laterality Date   DILATION AND CURETTAGE OF UTERUS     ESOPHAGOGASTRODUODENOSCOPY (EGD) WITH PROPOFOL  N/A 08/05/2021   Procedure: ESOPHAGOGASTRODUODENOSCOPY (EGD) WITH PROPOFOL ;  Surgeon: Quintin Buckle, DO;  Location: [facility] ENDOSCOPY;  Service: Gastroenterology;  Laterality: N/A;  DM    OB History   No obstetric history on file.      Home Medications    Prior to Admission medications   Medication Sig Start Date End Date Taking? Authorizing Provider  fluconazole (DIFLUCAN) 150 MG tablet Take 1 tablet (150 mg total) by mouth every three (3) days as needed for up to 2 doses. 11/24/23  Yes Nissim Fleischer R, NP  dicyclomine  (BENTYL ) 10 MG capsule Take 1 capsule (10 mg total) by mouth 4 (four) times daily -  before meals and at bedtime for 5 days. 06/10/20 06/15/20  Loman Risk, MD  diphenhydrAMINE  (BENADRYL ) 25 mg capsule Take 2 capsules (50 mg total) by mouth every 6 (six) hours as needed. 06/21/17   Jacquie Maudlin, MD  Dulaglutide (TRULICITY) 3 MG/0.5ML SOPN Inject into the skin.    [provider]  glipiZIDE  (GLUCOTROL  XL) 5  MG 24 hr tablet Take 1 tablet (5 mg total) by mouth daily. 06/21/17  Yes Jacquie Maudlin, MD  hydrochlorothiazide (HYDRODIURIL) 25 MG tablet Take 25 mg by mouth daily.   Yes [provider]  hyoscyamine (LEVSIN SL) 0.125 MG SL tablet Place 0.125 mg under the tongue every 4 (four) hours as needed.    [provider]  ibuprofen  (ADVIL ) 600 MG tablet Take 1 tablet (600 mg total) by mouth every 8 (eight) hours as needed for moderate pain. 06/10/20  Yes Loman Risk, MD  levothyroxine (SYNTHROID, LEVOTHROID) 112 MCG tablet Take 1 tablet by mouth 2 (two) times daily.  01/18/16 01/17/17  [provider]  losartan (COZAAR) 100 MG tablet Take 100 mg by mouth daily.   Yes [provider]  metFORMIN (GLUCOPHAGE) 1000 MG tablet Take 1,000 mg by mouth daily with breakfast.   Yes [provider]  metoCLOPramide  (REGLAN ) 10 MG tablet Take 1 tablet (10 mg total) by mouth every 6 (six) hours as needed. 06/21/17   Jacquie Maudlin, MD  naproxen  (NAPROSYN ) 500 MG tablet Take 1 tablet (500 mg total) by mouth 2 (two) times daily with a meal. 06/21/17   Jacquie Maudlin, MD  norethindrone-ethinyl estradiol-iron (JUNEL FE 1.5/30) 1.5-30 MG-MCG tablet Take 1 tablet by mouth daily. 09/21/15   [provider]  pantoprazole (PROTONIX) 40 MG tablet Take 40 mg by mouth daily.    [provider]  promethazine  (  PHENERGAN ) 25 MG tablet Take 1 tablet (25 mg total) by mouth every 8 (eight) hours as needed for nausea or vomiting. 06/10/20   Loman Risk, MD  SUMAtriptan  (IMITREX ) 100 MG tablet Take 1 tablet (100 mg total) by mouth daily as needed. 06/21/17   Jacquie Maudlin, MD    Family History History reviewed. No pertinent family history.  Social History Social History   Tobacco Use   Smoking status: Former    Current packs/day: 0.00    Types: Cigarettes    Quit date: 08/10/2001    Years since quitting: 22.3   Smokeless tobacco: Never  Vaping Use    Vaping status: Never Used  Substance Use Topics   Alcohol use: No   Drug use: No     Allergies   Amoxicillin, Penicillins, and Prednisone   Review of Systems Review of Systems   Physical Exam Triage Vital Signs ED Triage Vitals  Encounter Vitals Group     BP 11/24/23 0945 (!) 148/96     Systolic BP Percentile --      Diastolic BP Percentile --      Pulse Rate 11/24/23 0945 93     Resp 11/24/23 0945 18     Temp 11/24/23 0945 98.2 F (36.8 C)     Temp src --      SpO2 11/24/23 0945 93 %     Weight --      Height --      Head Circumference --      Peak Flow --      Pain Score 11/24/23 0946 5     Pain Loc --      Pain Education --      Exclude from Growth Chart --    No data found.  Updated Vital Signs BP (!) 148/96   Pulse 93   Temp 98.2 F (36.8 C)   Resp 18   LMP 11/15/2023   SpO2 93%   Visual Acuity Right Eye Distance:   Left Eye Distance:   Bilateral Distance:    Right Eye Near:   Left Eye Near:    Bilateral Near:     Physical Exam Constitutional:      Appearance: Normal appearance.  Eyes:     Extraocular Movements: Extraocular movements intact.  Pulmonary:     Effort: Pulmonary effort is normal.  Abdominal:     Tenderness: There is no abdominal tenderness. There is no right CVA tenderness, left CVA tenderness or guarding.  Neurological:     Mental Status: She is alert and oriented to person, place, and time. Mental status is at baseline.      UC Treatments / Results  Labs (all labs ordered are listed, but only abnormal results are displayed) Labs Reviewed  POCT URINALYSIS DIP (MANUAL ENTRY) - Abnormal; Notable for the following components:      Result Value   Clarity, UA cloudy (*)    Glucose, UA =500 (*)    Blood, UA trace-intact (*)    Protein Ur, POC =100 (*)    All other components within normal limits  POCT FASTING CBG [facility] MANUAL ENTRY - Abnormal; Notable for the following components:   POCT Glucose ([facility]) 334 (*)    All other  components within normal limits  URINE CULTURE    EKG   Radiology No results found.  Procedures Procedures (including critical care time)  Medications Ordered in UC Medications - No data to display  Initial Impression / Assessment and Plan /  UC Course  I have reviewed the triage vital signs and the nursing notes.  Pertinent labs & imaging results that were available during my care of the patient were reviewed by me and considered in my medical decision making (see chart for details).  Vaginal discharge, dysuria, glucosuria  Urinalysis is negative, does show glucose greater than 500, history of diabetes, taking oral glipizide , endorses she is supposed to be taking Ozempic but is intolerable to the medication, advised to notify her primary doctor or endocrinologist for further evaluation and management, including CBG 334, patient asymptomatic for DKA, symptoms most likely related to yeast, discussed with patient, vaginal swab checking for yeast and BV pending, recommended supportive care and prescribed Diflucan, may follow-up with urgent care as needed Final Clinical Impressions(s) / UC Diagnoses   Final diagnoses:  Vaginal discharge  Dysuria  Glucosuria   Discharge Instructions      Today you are being treated prophylactically for yeast.  Urinalysis is negative for bladder infection, has been sent to the lab to determine if bacteria will grow, you will be notified if that occurs and antibiotic sent to pharmacy  Your urinalysis had a excessive amount of sugar within it which is not normal and your blood sugar today was elevated in clinic at 334, please continue to take all daily medication as directed and please notify your primary doctor as you may need adjustments to your medication  Take diflucan 150 mg once, if symptoms still present in 3 days then you may take second pill   Yeast infections which are caused by a naturally occurring fungus called candida. Vaginosis is an  inflammation of the vagina that can result in discharge, itching and pain. The cause is usually a change in the normal balance of vaginal bacteria or an infection. Vaginosis can also result from reduced estrogen levels after menopause.  Labs pending 2-3 days, you will be contacted if positive   In addition:   Avoid baths, hot tubs and whirlpool spas.  Don't use scented or harsh soaps, such as those with deodorant or antibacterial action. Avoid irritants. These include scented tampons and pads. Wipe from front to back after using the toilet.  Don't douche. Your vagina doesn't require cleansing other than normal bathing.  Use a  condom. Wear cotton underwear, this fabric helps absorb moisture    ED Prescriptions     Medication Sig Dispense Auth. Provider   fluconazole (DIFLUCAN) 150 MG tablet Take 1 tablet (150 mg total) by mouth every three (3) days as needed for up to 2 doses. 2 tablet Lucy Boardman R, NP      PDMP not reviewed this encounter.   Reena Canning, NP 11/24/23 1006

## 2023-11-24 NOTE — ED Triage Notes (Signed)
 PT has had dysuria and vag discharge off and on for 2 weeks.

## 2023-11-24 NOTE — Discharge Instructions (Addendum)
 Today you are being treated prophylactically for yeast.  Urinalysis is negative for bladder infection, has been sent to the lab to determine if bacteria will grow, you will be notified if that occurs and antibiotic sent to pharmacy  Your urinalysis had a excessive amount of sugar within it which is not normal and your blood sugar today was elevated in clinic at 334, please continue to take all daily medication as directed and please notify your primary doctor as you may need adjustments to your medication  Take diflucan 150 mg once, if symptoms still present in 3 days then you may take second pill   Yeast infections which are caused by a naturally occurring fungus called candida. Vaginosis is an inflammation of the vagina that can result in discharge, itching and pain. The cause is usually a change in the normal balance of vaginal bacteria or an infection. Vaginosis can also result from reduced estrogen levels after menopause.  Labs pending 2-3 days, you will be contacted if positive   In addition:   Avoid baths, hot tubs and whirlpool spas.  Don't use scented or harsh soaps, such as those with deodorant or antibacterial action. Avoid irritants. These include scented tampons and pads. Wipe from front to back after using the toilet.  Don't douche. Your vagina doesn't require cleansing other than normal bathing.  Use a  condom. Wear cotton underwear, this fabric helps absorb moisture

## 2023-11-25 LAB — URINE CULTURE: Culture: 20000 — AB

## 2023-11-26 ENCOUNTER — Ambulatory Visit (HOSPITAL_COMMUNITY): Payer: Self-pay

## 2023-11-26 MED ORDER — NITROFURANTOIN MONOHYD MACRO 100 MG PO CAPS: 100.0000 mg | ORAL_CAPSULE | Freq: Two times a day (BID) | ORAL | 0 refills | Status: AC

## 2023-11-28 LAB — CERVICOVAGINAL ANCILLARY ONLY
Comment: NEGATIVE
Comment: NEGATIVE
Comment: NEGATIVE

## 2024-01-08 ENCOUNTER — Ambulatory Visit
Admission: EM | Admit: 2024-01-08 | Discharge: 2024-01-08 | Disposition: A | Discharge status: Home / Self Care | Attending: Emergency Medicine | Admitting: Emergency Medicine

## 2024-01-08 DIAGNOSIS — R112 Nausea with vomiting, unspecified: Secondary | ICD-10-CM

## 2024-01-08 DIAGNOSIS — J020 Streptococcal pharyngitis: Secondary | ICD-10-CM

## 2024-01-08 LAB — POCT RAPID STREP A (OFFICE): Rapid Strep A Screen: POSITIVE — AB

## 2024-01-08 LAB — POC SARS CORONAVIRUS 2 AG -  ED: SARS Coronavirus 2 Ag: NEGATIVE

## 2024-01-08 MED ORDER — ONDANSETRON 4 MG PO TBDP: 4.0000 mg | ORAL_TABLET | Freq: Three times a day (TID) | ORAL | 0 refills | Status: AC | PRN

## 2024-01-08 MED ORDER — AZITHROMYCIN 250 MG PO TABS: 250.0000 mg | ORAL_TABLET | Freq: Every day | ORAL | 0 refills | Status: AC

## 2024-01-08 MED ORDER — ONDANSETRON 4 MG PO TBDP
4.0000 mg | ORAL_TABLET | Freq: Once | ORAL | Status: AC
  Administered 2024-01-08: 4 mg via ORAL

## 2024-01-08 NOTE — ED Provider Notes (Signed)
 Donna Santiago    CSN: [id number] Arrival date & time: 01/08/24  0801      History   Chief Complaint Chief Complaint  Patient presents with   Generalized Body Aches   Sore Throat   Headache    HPI Donna Santiago is a 38 y.o. female.  Patient presents with chills, body aches, sore throat, nausea, vomiting since last night.  No OTC medications taken today.  No fever, cough, shortness of breath, diarrhea, abdominal pain.  The history is provided by the patient and medical records.    Past Medical History:  Diagnosis Date   Diabetes mellitus without complication (HCC)    Hypertension    Hypothyroidism    Migraines    Morbid obesity with body mass index (BMI) of 50.0 to 59.9 in adult [facility])     There are no active problems to display for this patient.   Past Surgical History:  Procedure Laterality Date   DILATION AND CURETTAGE OF UTERUS     ESOPHAGOGASTRODUODENOSCOPY (EGD) WITH PROPOFOL  N/A 08/05/2021   Procedure: ESOPHAGOGASTRODUODENOSCOPY (EGD) WITH PROPOFOL ;  Surgeon: Onita Elspeth Sharper, DO;  Location: [facility] ENDOSCOPY;  Service: Gastroenterology;  Laterality: N/A;  DM    OB History   No obstetric history on file.      Home Medications    Prior to Admission medications   Medication Sig Start Date End Date Taking? Authorizing Provider  azithromycin  (ZITHROMAX ) 250 MG tablet Take 1 tablet (250 mg total) by mouth daily. Take first 2 tablets together, then 1 every day until finished. 01/08/24  Yes Corlis Burnard DEL, NP  ondansetron  (ZOFRAN -ODT) 4 MG disintegrating tablet Take 1 tablet (4 mg total) by mouth every 8 (eight) hours as needed for nausea or vomiting. 01/08/24  Yes Corlis Burnard DEL, NP  dicyclomine  (BENTYL ) 10 MG capsule Take 1 capsule (10 mg total) by mouth 4 (four) times daily -  before meals and at bedtime for 5 days. Patient not taking: Reported on 01/08/2024 06/10/20 06/15/20  Angelena Smalls, MD  diphenhydrAMINE  (BENADRYL ) 25 mg capsule Take 2 capsules (50  mg total) by mouth every 6 (six) hours as needed. 06/21/17   Viviann Pastor, MD  Dulaglutide (TRULICITY) 3 MG/0.5ML SOPN Inject into the skin. Patient not taking: Reported on 01/08/2024    [provider]  fluconazole  (DIFLUCAN ) 150 MG tablet Take 1 tablet (150 mg total) by mouth every three (3) days as needed for up to 2 doses. Patient not taking: Reported on 01/08/2024 11/24/23   Teresa Shelba SAUNDERS, NP  glipiZIDE  (GLUCOTROL  XL) 5 MG 24 hr tablet Take 1 tablet (5 mg total) by mouth daily. 06/21/17   Viviann Pastor, MD  hydrochlorothiazide (HYDRODIURIL) 25 MG tablet Take 25 mg by mouth daily.    [provider]  hyoscyamine (LEVSIN SL) 0.125 MG SL tablet Place 0.125 mg under the tongue every 4 (four) hours as needed. Patient not taking: Reported on 01/08/2024    [provider]  ibuprofen  (ADVIL ) 600 MG tablet Take 1 tablet (600 mg total) by mouth every 8 (eight) hours as needed for moderate pain. 06/10/20   Angelena Smalls, MD  levothyroxine (SYNTHROID, LEVOTHROID) 112 MCG tablet Take 1 tablet by mouth 2 (two) times daily.  01/18/16 01/17/17  [provider]  losartan (COZAAR) 100 MG tablet Take 100 mg by mouth daily.    [provider]  metFORMIN (GLUCOPHAGE) 1000 MG tablet Take 1,000 mg by mouth daily with breakfast.    [provider]  metoCLOPramide  (  REGLAN ) 10 MG tablet Take 1 tablet (10 mg total) by mouth every 6 (six) hours as needed. Patient not taking: Reported on 01/08/2024 06/21/17   Viviann Pastor, MD  naproxen  (NAPROSYN ) 500 MG tablet Take 1 tablet (500 mg total) by mouth 2 (two) times daily with a meal. Patient not taking: Reported on 01/08/2024 06/21/17   Viviann Pastor, MD  nitrofurantoin , macrocrystal-monohydrate, (MACROBID ) 100 MG capsule Take 1 capsule (100 mg total) by mouth 2 (two) times daily. Patient not taking: Reported on 01/08/2024 11/26/23   Hermanns, Ashlee P, PA-C  norethindrone-ethinyl estradiol-iron (JUNEL FE 1.5/30)  1.5-30 MG-MCG tablet Take 1 tablet by mouth daily. Patient not taking: Reported on 01/08/2024 09/21/15   [provider]  pantoprazole (PROTONIX) 40 MG tablet Take 40 mg by mouth daily. Patient not taking: Reported on 01/08/2024    [provider]  promethazine  (PHENERGAN ) 25 MG tablet Take 1 tablet (25 mg total) by mouth every 8 (eight) hours as needed for nausea or vomiting. Patient not taking: Reported on 01/08/2024 06/10/20   Angelena Smalls, MD  SUMAtriptan  (IMITREX ) 100 MG tablet Take 1 tablet (100 mg total) by mouth daily as needed. 06/21/17   Viviann Pastor, MD    Family History History reviewed. No pertinent family history.  Social History Social History   Tobacco Use   Smoking status: Former    Current packs/day: 0.00    Types: Cigarettes    Quit date: 08/10/2001    Years since quitting: 22.4   Smokeless tobacco: Never  Vaping Use   Vaping status: Never Used  Substance Use Topics   Alcohol use: No   Drug use: No     Allergies   Amoxicillin, Penicillins, and Prednisone   Review of Systems Review of Systems  Constitutional:  Positive for chills. Negative for fever.  HENT:  Positive for sore throat. Negative for ear pain.   Respiratory:  Negative for cough and shortness of breath.   Gastrointestinal:  Positive for nausea and vomiting. Negative for abdominal pain and diarrhea.     Physical Exam Triage Vital Signs ED Triage Vitals  Encounter Vitals Group     BP      Girls Systolic BP Percentile      Girls Diastolic BP Percentile      Boys Systolic BP Percentile      Boys Diastolic BP Percentile      Pulse      Resp      Temp      Temp src      SpO2      Weight      Height      Head Circumference      Peak Flow      Pain Score      Pain Loc      Pain Education      Exclude from Growth Chart    No data found.  Updated Vital Signs BP 126/86   Pulse 90   Temp 97.7 F (36.5 C)   Resp 18   LMP 01/03/2024   SpO2 95%   Visual  Acuity Right Eye Distance:   Left Eye Distance:   Bilateral Distance:    Right Eye Near:   Left Eye Near:    Bilateral Near:     Physical Exam Constitutional:      General: She is not in acute distress.    Appearance: She is ill-appearing.  HENT:     Right Ear: Tympanic membrane normal.  Left Ear: Tympanic membrane normal.     Nose: Nose normal.     Mouth/Throat:     Mouth: Mucous membranes are moist.     Pharynx: Posterior oropharyngeal erythema present.  Cardiovascular:     Rate and Rhythm: Normal rate and regular rhythm.     Heart sounds: Normal heart sounds.  Pulmonary:     Effort: Pulmonary effort is normal. No respiratory distress.     Breath sounds: Normal breath sounds.  Abdominal:     General: Bowel sounds are normal.     Palpations: Abdomen is soft.     Tenderness: There is no abdominal tenderness. There is no guarding or rebound.  Neurological:     Mental Status: She is alert.      UC Treatments / Results  Labs (all labs ordered are listed, but only abnormal results are displayed) Labs Reviewed  POCT RAPID STREP A (OFFICE) - Abnormal; Notable for the following components:      Result Value   Rapid Strep A Screen Positive (*)    All other components within normal limits  POC SARS CORONAVIRUS 2 AG -  ED    EKG   Radiology No results found.  Procedures Procedures (including critical care time)  Medications Ordered in UC Medications  ondansetron  (ZOFRAN -ODT) disintegrating tablet 4 mg (4 mg Oral Given 01/08/24 0818)    Initial Impression / Assessment and Plan / UC Course  I have reviewed the triage vital signs and the nursing notes.  Pertinent labs & imaging results that were available during my care of the patient were reviewed by me and considered in my medical decision making (see chart for details).    Strep pharyngitis, Nausea and vomiting.  Afebrile and vital signs are stable.  Rapid strep positive.  COVID-negative.  Zofran  given here  for nausea and vomiting.  Discussed hydration with clear liquids such as water or Pedialyte.  Advance diet as tolerated.  Treating strep throat with Zithromax  (allergy to penicillin).  Treating nausea and vomiting with Zofran .  Education provided on strep throat, nausea and vomiting.  ED precautions given.  Instructed patient to follow-up with her PCP.  She agrees to plan of care.   Final Clinical Impressions(s) / UC Diagnoses   Final diagnoses:  Strep pharyngitis  Nausea and vomiting, unspecified vomiting type     Discharge Instructions      Take the Zithromax  as directed for strep throat.    Take the antinausea medication as directed.    Keep yourself hydrated with clear liquids, such as water.  Advance your diet as tolerated.   Go to the emergency department if you have worsening symptoms.    Follow up with your primary care provider.        ED Prescriptions     Medication Sig Dispense Auth. Provider   ondansetron  (ZOFRAN -ODT) 4 MG disintegrating tablet Take 1 tablet (4 mg total) by mouth every 8 (eight) hours as needed for nausea or vomiting. 20 tablet Corlis Burnard DEL, NP   azithromycin  (ZITHROMAX ) 250 MG tablet Take 1 tablet (250 mg total) by mouth daily. Take first 2 tablets together, then 1 every day until finished. 6 tablet Corlis Burnard DEL, NP      PDMP not reviewed this encounter.   Corlis Burnard DEL, NP 01/08/24 [id number]

## 2024-01-08 NOTE — ED Triage Notes (Signed)
 Patient to Urgent Care with complaints of sore throat/ emesis/ body aches/ chills. Denies any fevers.   Reports symptoms started last night.

## 2024-01-08 NOTE — Discharge Instructions (Addendum)
 Take the Zithromax  as directed for strep throat.    Take the antinausea medication as directed.    Keep yourself hydrated with clear liquids, such as water.  Advance your diet as tolerated.   Go to the emergency department if you have worsening symptoms.    Follow up with your primary care provider.

## 2024-01-20 ENCOUNTER — Encounter: Payer: Self-pay | Admitting: Emergency Medicine

## 2024-01-20 ENCOUNTER — Ambulatory Visit
Admission: EM | Admit: 2024-01-20 | Discharge: 2024-01-20 | Disposition: A | Discharge status: Home / Self Care | Attending: Emergency Medicine | Admitting: Emergency Medicine

## 2024-01-20 DIAGNOSIS — J020 Streptococcal pharyngitis: Secondary | ICD-10-CM | POA: Diagnosis not present

## 2024-01-20 LAB — POCT RAPID STREP A (OFFICE): Rapid Strep A Screen: POSITIVE — AB

## 2024-01-20 MED ORDER — CLINDAMYCIN HCL 150 MG PO CAPS: 150.0000 mg | ORAL_CAPSULE | Freq: Three times a day (TID) | ORAL | 0 refills | Status: AC

## 2024-01-20 MED ORDER — CYCLOBENZAPRINE HCL 10 MG PO TABS: 10.0000 mg | ORAL_TABLET | Freq: Every day | ORAL | 0 refills | Status: AC

## 2024-01-20 MED ORDER — DICLOFENAC SODIUM 75 MG PO TBEC: 75.0000 mg | DELAYED_RELEASE_TABLET | Freq: Two times a day (BID) | ORAL | 0 refills | Status: AC

## 2024-01-20 NOTE — ED Triage Notes (Signed)
 Patient in office today complaint of nausea,back pain, sorethroat, ear pain and HA x 2wks on and off   NUR:Palmenqzw  Denies: vomiting, fever

## 2024-01-20 NOTE — Discharge Instructions (Addendum)
 Your rapid strep test today was positive  Take clindamycin  every 8 hours for 7 days, daily will see improvement in about 48 hours and steady progression from there  May use diclofenac  twice daily to help reduce pain, this will replace your ibuprofen , may use over-the-counter Tylenol  May use Flexeril  at bedtime to allow for rest to help with body discomfort  May use of salt gargles throat lozenges, warm liquids, teaspoons of honey and over-the-counter clippers septic spray for comfort  You may follow-up at urgent care as needed

## 2024-01-20 NOTE — ED Provider Notes (Signed)
 Donna Santiago    CSN: [id number] Arrival date & time: 01/20/24  9086      History   Chief Complaint Chief Complaint  Patient presents with   Back Pain   Sore Throat   Nausea   Otalgia   Headache    HPI Donna Santiago is a 38 y.o. female.   Patient presents for evaluation of worsening body aches primarily to the lower back, nasal congestion, productive cough, sore throat, left-sided ear pain and nausea without vomiting present for 2 weeks.  Was evaluated in this urgent care, diagnosed with strep, completed antibiotics and endorses some improvement but then symptoms worsened after use.  Decreased appetite, has not attempted to increase fluids.  Additionally has taken ibuprofen .   Past Medical History:  Diagnosis Date   Diabetes mellitus without complication (HCC)    Hypertension    Hypothyroidism    Migraines    Morbid obesity with body mass index (BMI) of 50.0 to 59.9 in adult [facility])     There are no active problems to display for this patient.   Past Surgical History:  Procedure Laterality Date   DILATION AND CURETTAGE OF UTERUS     ESOPHAGOGASTRODUODENOSCOPY (EGD) WITH PROPOFOL  N/A 08/05/2021   Procedure: ESOPHAGOGASTRODUODENOSCOPY (EGD) WITH PROPOFOL ;  Surgeon: Onita Elspeth Sharper, DO;  Location: [facility] ENDOSCOPY;  Service: Gastroenterology;  Laterality: N/A;  DM    OB History   No obstetric history on file.      Home Medications    Prior to Admission medications   Medication Sig Start Date End Date Taking? Authorizing Provider  clindamycin  (CLEOCIN ) 150 MG capsule Take 1 capsule (150 mg total) by mouth 3 (three) times daily for 7 days. 01/20/24 01/27/24 Yes Magda Muise R, NP  cyclobenzaprine  (FLEXERIL ) 10 MG tablet Take 1 tablet (10 mg total) by mouth at bedtime. 01/20/24  Yes Idrees Quam R, NP  diclofenac  (VOLTAREN ) 75 MG EC tablet Take 1 tablet (75 mg total) by mouth 2 (two) times daily for 10 days. 01/20/24 01/30/24 Yes Betzalel Umbarger R, NP   azithromycin  (ZITHROMAX ) 250 MG tablet Take 1 tablet (250 mg total) by mouth daily. Take first 2 tablets together, then 1 every day until finished. 01/08/24   Corlis Burnard DEL, NP  dicyclomine  (BENTYL ) 10 MG capsule Take 1 capsule (10 mg total) by mouth 4 (four) times daily -  before meals and at bedtime for 5 days. Patient not taking: Reported on 01/08/2024 06/10/20 06/15/20  Angelena Smalls, MD  diphenhydrAMINE  (BENADRYL ) 25 mg capsule Take 2 capsules (50 mg total) by mouth every 6 (six) hours as needed. 06/21/17   Viviann Pastor, MD  Dulaglutide (TRULICITY) 3 MG/0.5ML SOPN Inject into the skin. Patient not taking: Reported on 01/08/2024    [provider]  fluconazole  (DIFLUCAN ) 150 MG tablet Take 1 tablet (150 mg total) by mouth every three (3) days as needed for up to 2 doses. Patient not taking: Reported on 01/08/2024 11/24/23   Teresa Shelba SAUNDERS, NP  glipiZIDE  (GLUCOTROL  XL) 5 MG 24 hr tablet Take 1 tablet (5 mg total) by mouth daily. 06/21/17   Viviann Pastor, MD  hydrochlorothiazide (HYDRODIURIL) 25 MG tablet Take 25 mg by mouth daily.    [provider]  hyoscyamine (LEVSIN SL) 0.125 MG SL tablet Place 0.125 mg under the tongue every 4 (four) hours as needed. Patient not taking: Reported on 01/08/2024    [provider]  ibuprofen  (ADVIL ) 600 MG tablet Take 1 tablet (600 mg total) by  mouth every 8 (eight) hours as needed for moderate pain. 06/10/20   Angelena Smalls, MD  levothyroxine (SYNTHROID, LEVOTHROID) 112 MCG tablet Take 1 tablet by mouth 2 (two) times daily.  01/18/16 01/17/17  [provider]  losartan (COZAAR) 100 MG tablet Take 100 mg by mouth daily.    [provider]  metFORMIN (GLUCOPHAGE) 1000 MG tablet Take 1,000 mg by mouth daily with breakfast.    [provider]  metoCLOPramide  (REGLAN ) 10 MG tablet Take 1 tablet (10 mg total) by mouth every 6 (six) hours as needed. Patient not taking: Reported on 01/08/2024 06/21/17   Viviann Pastor, MD  naproxen  (NAPROSYN ) 500 MG tablet Take 1 tablet (500 mg total) by mouth 2 (two) times daily with a meal. Patient not taking: Reported on 01/08/2024 06/21/17   Viviann Pastor, MD  nitrofurantoin , macrocrystal-monohydrate, (MACROBID ) 100 MG capsule Take 1 capsule (100 mg total) by mouth 2 (two) times daily. Patient not taking: Reported on 01/08/2024 11/26/23   Hermanns, Ashlee P, PA-C  norethindrone-ethinyl estradiol-iron (JUNEL FE 1.5/30) 1.5-30 MG-MCG tablet Take 1 tablet by mouth daily. Patient not taking: Reported on 01/08/2024 09/21/15   [provider]  ondansetron  (ZOFRAN -ODT) 4 MG disintegrating tablet Take 1 tablet (4 mg total) by mouth every 8 (eight) hours as needed for nausea or vomiting. 01/08/24   Corlis Burnard DEL, NP  pantoprazole (PROTONIX) 40 MG tablet Take 40 mg by mouth daily. Patient not taking: Reported on 01/08/2024    [provider]  promethazine  (PHENERGAN ) 25 MG tablet Take 1 tablet (25 mg total) by mouth every 8 (eight) hours as needed for nausea or vomiting. Patient not taking: Reported on 01/08/2024 06/10/20   Angelena Smalls, MD  SUMAtriptan  (IMITREX ) 100 MG tablet Take 1 tablet (100 mg total) by mouth daily as needed. 06/21/17   Viviann Pastor, MD    Family History No family history on file.  Social History Social History   Tobacco Use   Smoking status: Former    Current packs/day: 0.00    Types: Cigarettes    Quit date: 08/10/2001    Years since quitting: 22.4   Smokeless tobacco: Never  Vaping Use   Vaping status: Never Used  Substance Use Topics   Alcohol use: No   Drug use: No     Allergies   Amoxicillin, Penicillins, and Prednisone   Review of Systems Review of Systems   Physical Exam Triage Vital Signs ED Triage Vitals  Encounter Vitals Group     BP 01/20/24 0924 (!) 137/93     Girls Systolic BP Percentile --      Girls Diastolic BP Percentile --      Boys Systolic BP Percentile --      Boys Diastolic BP  Percentile --      Pulse Rate 01/20/24 0924 88     Resp 01/20/24 0924 16     Temp 01/20/24 0924 98.2 F (36.8 C)     Temp Source 01/20/24 0924 Oral     SpO2 01/20/24 0924 95 %     Weight 01/20/24 0921 289 lb (131.1 kg)     Height 01/20/24 0921 5' 5 (1.651 m)     Head Circumference --      Peak Flow --      Pain Score 01/20/24 0920 5     Pain Loc --      Pain Education --      Exclude from Growth Chart --    No data found.  Updated Vital Signs BP (!) 137/93 (BP Location: Left Arm)   Pulse 88   Temp 98.2 F (36.8 C) (Oral)   Resp 16   Ht 5' 5 (1.651 m)   Wt 289 lb (131.1 kg)   LMP 01/03/2024   SpO2 95%   BMI 48.09 kg/m   Visual Acuity Right Eye Distance:   Left Eye Distance:   Bilateral Distance:    Right Eye Near:   Left Eye Near:    Bilateral Near:     Physical Exam Constitutional:      Appearance: Normal appearance.  HENT:     Right Ear: Tympanic membrane, ear canal and external ear normal.     Left Ear: Tympanic membrane, ear canal and external ear normal.     Nose: Congestion present.     Mouth/Throat:     Pharynx: Posterior oropharyngeal erythema present.     Tonsils: No tonsillar exudate. 3+ on the right. 3+ on the left.  Cardiovascular:     Rate and Rhythm: Normal rate and regular rhythm.     Pulses: Normal pulses.     Heart sounds: Normal heart sounds.  Pulmonary:     Effort: Pulmonary effort is normal.     Breath sounds: Normal breath sounds.  Lymphadenopathy:     Cervical: Cervical adenopathy present.  Neurological:     Mental Status: She is alert and oriented to person, place, and time. Mental status is at baseline.      UC Treatments / Results  Labs (all labs ordered are listed, but only abnormal results are displayed) Labs Reviewed  POCT RAPID STREP A (OFFICE) - Abnormal; Notable for the following components:      Result Value   Rapid Strep A Screen Positive (*)    All other components within normal limits     EKG   Radiology No results found.  Procedures Procedures (including critical care time)  Medications Ordered in UC Medications - No data to display  Initial Impression / Assessment and Plan / UC Course  I have reviewed the triage vital signs and the nursing notes.  Pertinent labs & imaging results that were available during my care of the patient were reviewed by me and considered in my medical decision making (see chart for details).  Strep pharyngitis  Rapid testing positive, discussed with patient, due to penicillin, prescribed clindamycin  recent use of Z-Pak, additionally prescribed diclofenac  and Flexeril , may attempt salt water gargles, throat lozenges, warm to cool liquids per preference, over-the-counter Chloraseptic spray and over-the-counter analgesics for management of discomfort, may follow-up with urgent care as needed if symptoms persist or worsen, note given    Final Clinical Impressions(s) / UC Diagnoses   Final diagnoses:  Strep pharyngitis     Discharge Instructions      Your rapid strep test today was positive  Take clindamycin  every 8 hours for 7 days, daily will see improvement in about 48 hours and steady progression from there  May use diclofenac  twice daily to help reduce pain, this will replace your ibuprofen , may use over-the-counter Tylenol  May use Flexeril  at bedtime to allow for rest to help with body discomfort  May use of salt gargles throat lozenges, warm liquids, teaspoons of honey and over-the-counter clippers septic spray for comfort  You may follow-up at urgent care as needed      ED Prescriptions     Medication Sig Dispense Auth. Provider   clindamycin  (CLEOCIN ) 150 MG capsule Take 1 capsule (150 mg  total) by mouth 3 (three) times daily for 7 days. 21 capsule Annise Boran R, NP   diclofenac  (VOLTAREN ) 75 MG EC tablet Take 1 tablet (75 mg total) by mouth 2 (two) times daily for 10 days. 20 tablet Dawne Casali R, NP    cyclobenzaprine  (FLEXERIL ) 10 MG tablet Take 1 tablet (10 mg total) by mouth at bedtime. 10 tablet Story Vanvranken R, NP      PDMP not reviewed this encounter.   Teresa Shelba SAUNDERS, [place] 01/20/24 [phone]
